# Patient Record
Sex: Female | Born: 1935
Health system: Southern US, Community
[De-identification: ages and names within clinical notes are randomized; demographics above are authoritative.]

## PROBLEM LIST (undated history)

## (undated) DIAGNOSIS — I10 Essential (primary) hypertension: Secondary | ICD-10-CM

## (undated) DIAGNOSIS — H33319 Horseshoe tear of retina without detachment, unspecified eye: Secondary | ICD-10-CM

## (undated) DIAGNOSIS — R911 Solitary pulmonary nodule: Secondary | ICD-10-CM

## (undated) DIAGNOSIS — K5790 Diverticulosis of intestine, part unspecified, without perforation or abscess without bleeding: Secondary | ICD-10-CM

## (undated) DIAGNOSIS — M8580 Other specified disorders of bone density and structure, unspecified site: Secondary | ICD-10-CM

## (undated) DIAGNOSIS — G43109 Migraine with aura, not intractable, without status migrainosus: Secondary | ICD-10-CM

## (undated) DIAGNOSIS — M1990 Unspecified osteoarthritis, unspecified site: Secondary | ICD-10-CM

## (undated) DIAGNOSIS — R931 Abnormal findings on diagnostic imaging of heart and coronary circulation: Secondary | ICD-10-CM

## (undated) DIAGNOSIS — M419 Scoliosis, unspecified: Secondary | ICD-10-CM

## (undated) DIAGNOSIS — G8929 Other chronic pain: Secondary | ICD-10-CM

## (undated) DIAGNOSIS — D869 Sarcoidosis, unspecified: Secondary | ICD-10-CM

## (undated) DIAGNOSIS — M549 Dorsalgia, unspecified: Secondary | ICD-10-CM

## (undated) HISTORY — DX: Abnormal findings on diagnostic imaging of heart and coronary circulation: R93.1

## (undated) HISTORY — DX: Other specified disorders of bone density and structure, unspecified site: M85.80

## (undated) HISTORY — DX: Other chronic pain: G89.29

## (undated) HISTORY — DX: Dorsalgia, unspecified: M54.9

## (undated) HISTORY — DX: Diverticulosis of intestine, part unspecified, without perforation or abscess without bleeding: K57.90

## (undated) HISTORY — DX: Scoliosis, unspecified: M41.9

## (undated) HISTORY — DX: Essential (primary) hypertension: I10

## (undated) HISTORY — DX: Horseshoe tear of retina without detachment, unspecified eye: H33.319

## (undated) HISTORY — PX: BACK SURGERY: SHX140

## (undated) HISTORY — DX: Solitary pulmonary nodule: R91.1

## (undated) HISTORY — PX: ABDOMINAL HYSTERECTOMY: SHX81

## (undated) HISTORY — DX: Sarcoidosis, unspecified: D86.9

## (undated) HISTORY — PX: BILATERAL SALPINGOOPHORECTOMY: SHX1223

## (undated) HISTORY — DX: Migraine with aura, not intractable, without status migrainosus: G43.109

## (undated) HISTORY — DX: Unspecified osteoarthritis, unspecified site: M19.90

---

## 2006-01-06 ENCOUNTER — Ambulatory Visit: Payer: Self-pay | Admitting: Internal Medicine

## 2006-04-21 ENCOUNTER — Ambulatory Visit: Payer: Self-pay | Admitting: Family Medicine

## 2006-05-31 ENCOUNTER — Ambulatory Visit: Payer: Self-pay | Admitting: Internal Medicine

## 2006-07-16 ENCOUNTER — Ambulatory Visit: Payer: Self-pay | Admitting: Internal Medicine

## 2006-08-20 ENCOUNTER — Encounter: Admission: RE | Admit: 2006-08-20 | Discharge: 2006-08-20 | Payer: Self-pay | Admitting: Internal Medicine

## 2006-08-27 ENCOUNTER — Ambulatory Visit: Payer: Self-pay | Admitting: Internal Medicine

## 2007-01-03 ENCOUNTER — Ambulatory Visit: Payer: Self-pay | Admitting: Internal Medicine

## 2007-01-05 ENCOUNTER — Ambulatory Visit: Payer: Self-pay | Admitting: Internal Medicine

## 2007-01-05 LAB — CONVERTED CEMR LAB
BUN: 12 mg/dL (ref 6–23)
Basophils Absolute: 0 10*3/uL (ref 0.0–0.1)
CO2: 29 meq/L (ref 19–32)
Chloride: 106 meq/L (ref 96–112)
Cholesterol: 208 mg/dL (ref 0–200)
Creatinine, Ser: 0.7 mg/dL (ref 0.4–1.2)
Direct LDL: 120.9 mg/dL
GFR calc Af Amer: 106 mL/min
GFR calc non Af Amer: 88 mL/min
Glucose, Bld: 94 mg/dL (ref 70–99)
Hemoglobin: 13.5 g/dL (ref 12.0–15.0)
MCHC: 34.2 g/dL (ref 30.0–36.0)
MCV: 92.1 fL (ref 78.0–100.0)
Monocytes Absolute: 0.4 10*3/uL (ref 0.2–0.7)
Neutro Abs: 2.9 10*3/uL (ref 1.4–7.7)
Neutrophils Relative %: 57.3 % (ref 43.0–77.0)
RDW: 12.5 % (ref 11.5–14.6)
Sodium: 141 meq/L (ref 135–145)
VLDL: 23 mg/dL (ref 0–40)
Vit D, 1,25-Dihydroxy: 34 (ref 20–57)
WBC: 5 10*3/uL (ref 4.5–10.5)

## 2007-01-13 ENCOUNTER — Ambulatory Visit: Payer: Self-pay | Admitting: Internal Medicine

## 2007-01-13 LAB — CONVERTED CEMR LAB: Sed Rate: 14 mm/hr (ref 0–25)

## 2007-01-24 ENCOUNTER — Ambulatory Visit: Payer: Self-pay | Admitting: Internal Medicine

## 2007-03-22 DIAGNOSIS — J309 Allergic rhinitis, unspecified: Secondary | ICD-10-CM | POA: Insufficient documentation

## 2007-03-22 DIAGNOSIS — M899 Disorder of bone, unspecified: Secondary | ICD-10-CM | POA: Insufficient documentation

## 2007-03-22 DIAGNOSIS — I10 Essential (primary) hypertension: Secondary | ICD-10-CM | POA: Insufficient documentation

## 2007-03-22 DIAGNOSIS — M949 Disorder of cartilage, unspecified: Secondary | ICD-10-CM

## 2007-03-22 DIAGNOSIS — M1990 Unspecified osteoarthritis, unspecified site: Secondary | ICD-10-CM | POA: Insufficient documentation

## 2007-04-25 ENCOUNTER — Ambulatory Visit: Payer: Self-pay | Admitting: Internal Medicine

## 2007-04-26 ENCOUNTER — Encounter: Payer: Self-pay | Admitting: Internal Medicine

## 2007-05-19 ENCOUNTER — Ambulatory Visit: Payer: Self-pay | Admitting: Internal Medicine

## 2007-05-19 DIAGNOSIS — G4700 Insomnia, unspecified: Secondary | ICD-10-CM | POA: Insufficient documentation

## 2007-08-22 ENCOUNTER — Encounter: Admission: RE | Admit: 2007-08-22 | Discharge: 2007-08-22 | Payer: Self-pay | Admitting: Internal Medicine

## 2007-08-25 ENCOUNTER — Encounter: Payer: Self-pay | Admitting: Internal Medicine

## 2007-09-06 ENCOUNTER — Ambulatory Visit: Payer: Self-pay | Admitting: Internal Medicine

## 2007-09-15 ENCOUNTER — Telehealth: Payer: Self-pay | Admitting: Internal Medicine

## 2007-09-20 ENCOUNTER — Ambulatory Visit: Payer: Self-pay | Admitting: Internal Medicine

## 2007-09-20 DIAGNOSIS — F430 Acute stress reaction: Secondary | ICD-10-CM | POA: Insufficient documentation

## 2007-09-21 ENCOUNTER — Encounter: Payer: Self-pay | Admitting: Internal Medicine

## 2007-10-28 ENCOUNTER — Ambulatory Visit: Payer: Self-pay | Admitting: Internal Medicine

## 2007-11-01 ENCOUNTER — Telehealth: Payer: Self-pay | Admitting: Family Medicine

## 2007-11-01 ENCOUNTER — Ambulatory Visit: Payer: Self-pay | Admitting: Family Medicine

## 2007-11-01 DIAGNOSIS — G43109 Migraine with aura, not intractable, without status migrainosus: Secondary | ICD-10-CM | POA: Insufficient documentation

## 2007-11-09 ENCOUNTER — Ambulatory Visit: Payer: Self-pay | Admitting: Psychiatry

## 2007-11-23 ENCOUNTER — Telehealth: Payer: Self-pay | Admitting: Internal Medicine

## 2008-01-11 ENCOUNTER — Encounter: Payer: Self-pay | Admitting: Internal Medicine

## 2008-01-18 ENCOUNTER — Encounter: Payer: Self-pay | Admitting: Internal Medicine

## 2008-02-01 ENCOUNTER — Telehealth: Payer: Self-pay | Admitting: Internal Medicine

## 2008-03-12 ENCOUNTER — Ambulatory Visit: Payer: Self-pay | Admitting: Family Medicine

## 2008-07-04 ENCOUNTER — Encounter: Payer: Self-pay | Admitting: Internal Medicine

## 2008-07-10 ENCOUNTER — Ambulatory Visit: Payer: Self-pay | Admitting: Gastroenterology

## 2008-07-10 DIAGNOSIS — R1032 Left lower quadrant pain: Secondary | ICD-10-CM | POA: Insufficient documentation

## 2008-07-10 DIAGNOSIS — K5900 Constipation, unspecified: Secondary | ICD-10-CM | POA: Insufficient documentation

## 2008-07-13 ENCOUNTER — Ambulatory Visit: Payer: Self-pay | Admitting: Gastroenterology

## 2008-07-13 ENCOUNTER — Encounter: Payer: Self-pay | Admitting: Gastroenterology

## 2008-07-16 ENCOUNTER — Encounter: Payer: Self-pay | Admitting: Gastroenterology

## 2008-07-23 ENCOUNTER — Inpatient Hospital Stay (HOSPITAL_COMMUNITY): Admission: EM | Admit: 2008-07-23 | Discharge: 2008-07-24 | Payer: Self-pay | Admitting: Emergency Medicine

## 2008-07-23 ENCOUNTER — Encounter (INDEPENDENT_AMBULATORY_CARE_PROVIDER_SITE_OTHER): Payer: Self-pay | Admitting: Neurology

## 2008-07-24 ENCOUNTER — Encounter (INDEPENDENT_AMBULATORY_CARE_PROVIDER_SITE_OTHER): Payer: Self-pay | Admitting: Neurology

## 2008-07-24 ENCOUNTER — Encounter: Payer: Self-pay | Admitting: Internal Medicine

## 2008-07-31 ENCOUNTER — Ambulatory Visit: Payer: Self-pay | Admitting: Internal Medicine

## 2008-08-22 ENCOUNTER — Encounter: Admission: RE | Admit: 2008-08-22 | Discharge: 2008-08-22 | Payer: Self-pay | Admitting: Internal Medicine

## 2008-09-17 ENCOUNTER — Telehealth: Payer: Self-pay | Admitting: Internal Medicine

## 2008-09-17 ENCOUNTER — Encounter: Payer: Self-pay | Admitting: Internal Medicine

## 2008-10-09 ENCOUNTER — Ambulatory Visit: Payer: Self-pay | Admitting: Internal Medicine

## 2008-10-31 ENCOUNTER — Telehealth: Payer: Self-pay | Admitting: Internal Medicine

## 2009-02-15 ENCOUNTER — Ambulatory Visit: Payer: Self-pay | Admitting: Internal Medicine

## 2009-02-15 DIAGNOSIS — M79609 Pain in unspecified limb: Secondary | ICD-10-CM | POA: Insufficient documentation

## 2009-02-15 DIAGNOSIS — M25519 Pain in unspecified shoulder: Secondary | ICD-10-CM | POA: Insufficient documentation

## 2009-02-19 ENCOUNTER — Encounter: Admission: RE | Admit: 2009-02-19 | Discharge: 2009-02-19 | Payer: Self-pay | Admitting: Internal Medicine

## 2009-06-10 ENCOUNTER — Telehealth: Payer: Self-pay | Admitting: *Deleted

## 2009-09-16 ENCOUNTER — Encounter: Admission: RE | Admit: 2009-09-16 | Discharge: 2009-09-16 | Payer: Self-pay | Admitting: Internal Medicine

## 2009-10-21 ENCOUNTER — Ambulatory Visit: Payer: Self-pay | Admitting: Internal Medicine

## 2009-10-21 DIAGNOSIS — H6120 Impacted cerumen, unspecified ear: Secondary | ICD-10-CM | POA: Insufficient documentation

## 2009-10-21 DIAGNOSIS — H9190 Unspecified hearing loss, unspecified ear: Secondary | ICD-10-CM | POA: Insufficient documentation

## 2009-11-20 ENCOUNTER — Telehealth: Payer: Self-pay | Admitting: Internal Medicine

## 2010-02-08 DIAGNOSIS — H81399 Other peripheral vertigo, unspecified ear: Secondary | ICD-10-CM | POA: Insufficient documentation

## 2010-02-17 ENCOUNTER — Ambulatory Visit: Payer: Self-pay | Admitting: Family Medicine

## 2010-02-18 LAB — CONVERTED CEMR LAB
Basophils Absolute: 0 10*3/uL (ref 0.0–0.1)
Hemoglobin: 12.9 g/dL (ref 12.0–15.0)
Lymphs Abs: 1.6 10*3/uL (ref 0.7–4.0)
MCHC: 34 g/dL (ref 30.0–36.0)
MCV: 93.2 fL (ref 78.0–100.0)
Monocytes Absolute: 0.5 10*3/uL (ref 0.1–1.0)
RDW: 13.6 % (ref 11.5–14.6)
WBC: 6.6 10*3/uL (ref 4.5–10.5)

## 2010-02-20 ENCOUNTER — Telehealth (INDEPENDENT_AMBULATORY_CARE_PROVIDER_SITE_OTHER): Payer: Self-pay | Admitting: *Deleted

## 2010-02-20 ENCOUNTER — Ambulatory Visit: Payer: Self-pay | Admitting: Cardiology

## 2010-05-02 ENCOUNTER — Ambulatory Visit: Payer: Self-pay | Admitting: Internal Medicine

## 2010-05-02 DIAGNOSIS — R42 Dizziness and giddiness: Secondary | ICD-10-CM | POA: Insufficient documentation

## 2010-05-02 DIAGNOSIS — G43809 Other migraine, not intractable, without status migrainosus: Secondary | ICD-10-CM | POA: Insufficient documentation

## 2010-05-02 DIAGNOSIS — R5381 Other malaise: Secondary | ICD-10-CM | POA: Insufficient documentation

## 2010-05-02 DIAGNOSIS — R5383 Other fatigue: Secondary | ICD-10-CM | POA: Insufficient documentation

## 2010-05-03 ENCOUNTER — Encounter: Payer: Self-pay | Admitting: Internal Medicine

## 2010-05-03 LAB — CONVERTED CEMR LAB
Calcium, Total (PTH): 9.6 mg/dL (ref 8.4–10.5)
Vit D, 25-Hydroxy: 59 ng/mL (ref 30–89)

## 2010-05-05 ENCOUNTER — Encounter: Payer: Self-pay | Admitting: Internal Medicine

## 2010-05-05 ENCOUNTER — Ambulatory Visit: Payer: Self-pay | Admitting: Family Medicine

## 2010-05-07 LAB — CONVERTED CEMR LAB
ALT: 19 units/L (ref 0–35)
AST: 28 units/L (ref 0–37)
Alkaline Phosphatase: 89 units/L (ref 39–117)
BUN: 11 mg/dL (ref 6–23)
Basophils Absolute: 0 10*3/uL (ref 0.0–0.1)
CRP, High Sensitivity: 0.44 (ref 0.00–5.00)
Cholesterol: 213 mg/dL — ABNORMAL HIGH (ref 0–200)
Direct LDL: 120.7 mg/dL
Eosinophils Absolute: 0 10*3/uL (ref 0.0–0.7)
Eosinophils Relative: 0.6 % (ref 0.0–5.0)
Folate: 18.5 ng/mL
Free T4: 0.72 ng/dL (ref 0.60–1.60)
GFR calc non Af Amer: 88.43 mL/min (ref >60)
HCT: 37.2 % (ref 36.0–46.0)
HDL: 74.5 mg/dL (ref >39.00)
Hemoglobin: 12.8 g/dL (ref 12.0–15.0)
Lymphocytes Relative: 23.9 % (ref 12.0–46.0)
MCHC: 34.5 g/dL (ref 30.0–36.0)
MCV: 93.6 fL (ref 78.0–100.0)
Neutrophils Relative %: 68 % (ref 43.0–77.0)
Platelets: 225 10*3/uL (ref 150.0–400.0)
Potassium: 5 meq/L (ref 3.5–5.1)
RBC: 3.97 M/uL (ref 3.87–5.11)
Total Bilirubin: 1.4 mg/dL — ABNORMAL HIGH (ref 0.3–1.2)
Total CHOL/HDL Ratio: 3
Total Protein: 6.9 g/dL (ref 6.0–8.3)
Triglycerides: 100 mg/dL (ref 0.0–149.0)
VLDL: 20 mg/dL (ref 0.0–40.0)
Vitamin B-12: 350 pg/mL (ref 211–911)

## 2010-06-24 ENCOUNTER — Ambulatory Visit: Payer: Self-pay | Admitting: Internal Medicine

## 2010-06-24 DIAGNOSIS — I495 Sick sinus syndrome: Secondary | ICD-10-CM | POA: Insufficient documentation

## 2010-06-29 ENCOUNTER — Encounter: Payer: Self-pay | Admitting: Internal Medicine

## 2010-08-07 HISTORY — PX: CATARACT EXTRACTION: SUR2

## 2010-08-19 LAB — HM DIABETES EYE EXAM

## 2010-08-25 ENCOUNTER — Ambulatory Visit: Payer: Self-pay | Admitting: Internal Medicine

## 2010-09-16 ENCOUNTER — Encounter
Admission: RE | Admit: 2010-09-16 | Discharge: 2010-09-16 | Payer: Self-pay | Source: Home / Self Care | Attending: Internal Medicine | Admitting: Internal Medicine

## 2010-09-28 ENCOUNTER — Encounter: Payer: Self-pay | Admitting: Family Medicine

## 2010-09-28 ENCOUNTER — Encounter: Payer: Self-pay | Admitting: Internal Medicine

## 2010-10-07 NOTE — Progress Notes (Signed)
Summary: fyi bruise   Phone Note Call from Patient Call back at Home Phone [phone]   Caller: Patient Summary of Call: Pt has a huge bruise about the size of a fist on her upper arm where she dropped a dresser mirror the inside of her upper left arm. It is not throbbing or swollen. There is no redness to it. The center is turning yellow like it is clearing up. Her daughter states that it looks like it is getting better as well. I told pt that it does sound like it is getting better but if it gets red or looks like it might get infected then give [place] a call. Initial call taken by: Romualdo Bolk, CMA (AAMA),  November 20, 2009 9:45 AM

## 2010-10-07 NOTE — Assessment & Plan Note (Signed)
Summary: ear clogged//ccm   Vital Signs:  Patient profile:   77 year old female Menstrual status:  hysterectomy Height:      60.25 inches Weight:      130 pounds BMI:     25.27 Pulse rate:   72 / minute BP sitting:   120 / 80  (right arm) Cuff size:   regular  Vitals Entered By: Romualdo Bolk, CMA (AAMA) (October 21, 2009 3:44 PM) CC: Left ears clogged started back in dec and took sudafed then went away and came back on 2/11. No pain.   History of Present Illness: Kristin Jimenez comesin today for   because left ear feels clogged and can hear well out of it. No pain or sig uri. to go out of town soon.     No q tips.  Bp doing well. Mood  stable and not depressed.     Preventive Screening-Counseling & Management  Alcohol-Tobacco     Alcohol drinks/day: 1     Alcohol type: wine     Smoking Status: never  Caffeine-Diet-Exercise     Caffeine use/day: 2     Does Patient Exercise: yes     Type of exercise: water aerobics and walking     Times/week: 6  Current Medications (verified): 1)  Multivitamins  Tabs (Multiple Vitamin) .Marland Kitchen.. 1 Tablet By Mouth Three Times A Day 2)  Vitamin D3 10000 Unit Caps (Cholecalciferol) .Marland Kitchen.. 1 Capsule By Mouth Once Daily 3)  Calcium 600/vitamin D 600-400 Mg-Unit Tabs (Calcium Carbonate-Vitamin D) .Marland Kitchen.. 1 Tablet By Mouth Once Daily 4)  Aspirin 81 Mg  Tabs (Aspirin) 5)  Atenolol 50 Mg Tabs (Atenolol) .... One Per Day 6)  Ambien 5 Mg Tabs (Zolpidem Tartrate) .Marland Kitchen.. 1 By Mouth At Bedtime  Allergies (verified): No Known Drug Allergies  Past History:  Past medical, surgical, family and social histories (including risk factors) reviewed for relevance to current acute and chronic problems.  Past Medical History: Reviewed history from 10/09/2008 and no changes required. Osteoarthritis Osteopenia Hypertension Allergic rhinitis Ocular migraines Sarcoidosis OS retinal tear Scoliosis Chronic back pain Hosp   ocular migraine   consult  [place] nodule    [facility]    Consults Dr. Perry Mount in ED    Past Surgical History: Reviewed history from 07/10/2008 and no changes required. Hysterectomy BSO Back surgery x 2  Past History:  Care Management: Neurology: Pearlean Brownie in ED Gastroenterology: Russella Dar  Family History: Reviewed history from 06/22/2008 and no changes required. Family History of Aneurysm Aortic Family History of Arthritis Family History High cholesterol Family History Hypertension Family History Lung cancer Family History of Cardiovascular disorder Fam hx Stroke No FH of Colon Cancer:  Social History: Reviewed history from 10/09/2008 and no changes required. Retired Widowed, husband passed away in 03/25/2009Never Smoked Alcohol use-yes  wine every night.  red or white. Drug use-no Regular exercise-yes Daily Caffeine Use        Review of Systems  The patient denies vision loss, hoarseness, prolonged cough, abdominal pain, transient blindness, difficulty walking, depression, unusual weight change, abnormal bleeding, enlarged lymph nodes, and angioedema.    Physical Exam  General:  Well-developed,well-nourished,in no acute distress; alert,appropriate and cooperative throughout examination Head:  normocephalic, atraumatic, and no abnormalities observed.   Eyes:  vision grossly intact.   PERRL, EOMs full, conjunctiva clear  Ears:  R ear normal and no external deformities.  right ear  with brown wax     impacted  irrigated with water and tm intact and hearing some better  Nose:  no external deformity, no external erythema, and no nasal discharge.   Mouth:  pharynx pink and moist.   Neck:  No deformities, masses, or tenderness noted. Neurologic:  alert & oriented X3 and gait normal.   Cervical Nodes:  No lymphadenopathy noted Psych:  Oriented X3, good eye contact, and not anxious appearing.     Impression & Recommendations:  Problem # 1:  DECREASED HEARING, LEFT EAR  (ICD-389.9) related to wax   Problem # 2:  CERUMEN IMPACTION (ICD-380.4) left .   Problem # 3:  HYPERTENSION, LABILE (ICD-401.9) stable  Her updated medication list for this problem includes:    Atenolol 50 Mg Tabs (Atenolol) ..... One per day  Complete Medication List: 1)  Multivitamins Tabs (Multiple vitamin) .Marland Kitchen.. 1 tablet by mouth three times a day 2)  Vitamin D3 10000 Unit Caps (Cholecalciferol) .Marland Kitchen.. 1 capsule by mouth once daily 3)  Calcium 600/vitamin D 600-400 Mg-unit Tabs (Calcium carbonate-vitamin d) .Marland Kitchen.. 1 tablet by mouth once daily 4)  Aspirin 81 Mg Tabs (Aspirin) 5)  Atenolol 50 Mg Tabs (Atenolol) .... One per day 6)  Ambien 5 Mg Tabs (Zolpidem tartrate) .Marland Kitchen.. 1 by mouth at bedtime  Patient Instructions: 1)  recheck as needed .

## 2010-10-07 NOTE — Assessment & Plan Note (Signed)
Summary: FU ON DIZZINESS/NJR   Vital Signs:  Patient profile:   77 year old female Menstrual status:  hysterectomy Weight:      125 pounds Pulse rate:   72 / minute BP sitting:   110 / 64  (left arm) Cuff size:   regular  Vitals Entered By: Romualdo Bolk, CMA (AAMA) (May 02, 2010 10:14 AM) CC: Follow-up visit on Dizziness from back in June. Pt states that it comes and goes. She also has lots of fatigue with it. Pt is moving so she is under some stress from the move.   History of Present Illness: Kristin Jimenez comes in today   for dizziness . Had  major dizziness in  June    felt to be vertigo   peripheral cause   wiith vomiting  and then since then got  episodes transient  .   poss positional  without assoicate .sx   ...... For the past 2 weeks has noted  increasing  fatigue " tired al l the time "but sleeps well.      Not rested in am.      no bleeding  eating well and exercising . is a vegetarian.  mood  prob no change some anxiety at times  .    HAs: gets auras   with opthalm  migrains with no pain. more frequently  weekly inn thelast months    dont last long.     Has had them  since 1998 . minutes to     no rx for this ocass HA.         Preventive Screening-Counseling & Management  Alcohol-Tobacco     Alcohol drinks/day: 1     Alcohol type: wine     Smoking Status: never  Caffeine-Diet-Exercise     Caffeine use/day: 2     Does Patient Exercise: yes     Type of exercise: water aerobics and walking     Times/week: 6  Current Medications (verified): 1)  Multivitamins  Tabs (Multiple Vitamin) .Marland Kitchen.. 1 Tablet By Mouth Once Daily 2)  Vitamin D3 10000 Unit Caps (Cholecalciferol) .... 2  Capsule By Mouth Once Daily 3)  Calcium 600/vitamin D 600-400 Mg-Unit Tabs (Calcium Carbonate-Vitamin D) .Marland Kitchen.. 1 Tablet By Mouth Once Daily 4)  Aspirin 81 Mg  Tabs (Aspirin) 5)  Atenolol 50 Mg Tabs (Atenolol) .... 1/2 Per Day 6)  Ambien 5 Mg Tabs (Zolpidem Tartrate) .Marland Kitchen.. 1 By Mouth  At Bedtime As Needed  Allergies (verified): No Known Drug Allergies  Past History:  Past medical, surgical, family and social histories (including risk factors) reviewed, and no changes noted (except as noted below).  Past Medical History: Osteoarthritis Osteopenia Hypertension Allergic rhinitis Ocular migraines Sarcoidosis OS retinal tear Scoliosis Chronic back pain Hosp   ocular migraine   consult [place] nodules    Seen  [facility]    Consults Dr. Perry Mount in ED    Past Surgical History: Reviewed history from 07/10/2008 and no changes required. Hysterectomy BSO Back surgery x 2  Past History:  Care Management: Neurology: Pearlean Brownie in ED Gastroenterology: [facility]: pulm nodules  Opthal Nile Riggs   Family History: Reviewed history from 06/22/2008 and no changes required. Family History of Aneurysm Aortic Family History of Arthritis Family History High cholesterol Family History Hypertension Family History Lung cancer Family History of Cardiovascular disorder Fam hx Stroke No FH of Colon Cancer:  Social History: Reviewed history from 10/09/2008 and no changes required. Retired Widowed, husband passed  away in March 2009 Never Smoked Alcohol use-yes  wine every night.  red or white. Drug use-no Regular exercise-yes Daily Caffeine Use   is a vegetarian moslty       Review of Systems  The patient denies anorexia, fever, weight loss, weight gain, hoarseness, chest pain, syncope, dyspnea on exertion, peripheral edema, prolonged cough, abdominal pain, melena, hematochezia, difficulty walking, depression, unusual weight change, abnormal bleeding, enlarged lymph nodes, and angioedema.         vision change with her migraine    ocass ha  minimal   Physical Exam  General:  Well-developed,well-nourished,in no acute distress; alert,appropriate and cooperative throughout examination Head:  normocephalic and atraumatic.   Eyes:  PERRL, EOMs full,  conjunctiva clear  Ears:  R ear normal, L ear normal, and no external deformities.    mod wax left ear  Nose:  no external deformity, no external erythema, and no nasal discharge.   Mouth:  good dentition and pharynx pink and moist.   Neck:  No deformities, masses, or tenderness noted. no bruits heard Lungs:  Normal respiratory effort, chest expands symmetrically. Lungs are clear to auscultation, no crackles or wheezes. Heart:  Normal rate and regular rhythm. S1 and S2 normal without gallop, murmur, click, rub or other extra sounds. Abdomen:  Bowel sounds positive,abdomen soft and non-tender without masses, organomegaly or  noted. Pulses:  pulses intact without delay   Extremities:  no clubbing cyanosis or edema  Neurologic:  alert & oriented X3, cranial nerves IIi-XII intact, and  gait normal, DTRs symmetrical and normal, and Romberg negative.   good balance  non focla exam  Skin:  turgor normal, color normal, and no ecchymoses.   Cervical Nodes:  No lymphadenopathy noted Psych:  Oriented X3, normally interactive, good eye contact, not anxious appearing, and not depressed appearing.     Impression & Recommendations:  Problem # 1:  FATIGUE (ICD-780.79) r/o metabolic    poss not related to other signs is a vegetarion  check for  b12  defic .   no evidence of  sig sleep dx .  rarely takes an [place]  . has been on low dose atenolol  Orders: TLB-TSH (Thyroid Stimulating Hormone) (84443-TSH) TLB-Hepatic/Liver Function Pnl (80076-HEPATIC) TLB-CBC Platelet - w/Differential (85025-CBCD) TLB-BMP (Basic Metabolic Panel-BMET) (80048-METABOL) TLB-B12 + Folate Pnl (16109_60454-U98/JXB) TLB-CRP-High Sensitivity (C-Reactive Protein) (86140-FCRP) T-Parathyroid Hormone, Intact w/ Calcium (14782-95621) TLB-T4 (Thyrox), Free [phone]) Venipuncture (96295) Specimen Handling (28413)  Problem # 2:  MIGRAINE, OPHTHALMIC (ICD-346.80) Assessment: Deteriorated  more frequent recently about once a week.     ?   changing pattern.    followed by dr Nile Riggs  .  no focal signs on exam today .   rec   neuro  opinion  eval  poss other meds for suppression.   Her updated medication list for this problem includes:    Aspirin 81 Mg Tabs (Aspirin)    Atenolol 50 Mg Tabs (Atenolol) .Marland Kitchen... 1/2 per day  Orders: Neurology Referral (Neuro)  Problem # 3:  VERTIGO (ICD-780.4)  sounds     more vestibular and positional at present     today  normal cv exam otherwise   Orders: Neurology Referral (Neuro)  Problem # 4:  PULMONARY NODULE MULTIPLE [facility] [phone]) followed [facility].     Problem # 5:  HYPERTENSION, LABILE (ICD-401.9) controlled on very low dose med  for a while and   exxercising well.    Her updated medication list for this problem includes:  Atenolol 50 Mg Tabs (Atenolol) .Marland Kitchen... 1/2 per day  Orders: TLB-BMP (Basic Metabolic Panel-BMET) (80048-METABOL) TLB-Lipid Panel (80061-LIPID) Venipuncture (54098) Specimen Handling (11914)  Problem # 6:  OSTEOPENIA (ICD-733.90) due for dexa  Her updated medication list for this problem includes:    Vitamin D3 10000 Unit Caps (Cholecalciferol) .Marland Kitchen... 2  capsule by mouth once daily    Calcium 600/vitamin D 600-400 Mg-unit Tabs (Calcium carbonate-vitamin d) .Marland Kitchen... 1 tablet by mouth once daily  Orders: T-Vitamin D (25-Hydroxy) (78295-62130) T-Parathyroid Hormone, Intact w/ Calcium (86578-46962) Venipuncture (95284) Specimen Handling (13244)  Complete Medication List: 1)  Multivitamins Tabs (Multiple vitamin) .Marland Kitchen.. 1 tablet by mouth once daily 2)  Vitamin D3 10000 Unit Caps (Cholecalciferol) .... 2  capsule by mouth once daily 3)  Calcium 600/vitamin D 600-400 Mg-unit Tabs (Calcium carbonate-vitamin d) .Marland Kitchen.. 1 tablet by mouth once daily 4)  Aspirin 81 Mg Tabs (Aspirin) 5)  Atenolol 50 Mg Tabs (Atenolol) .... 1/2 per day 6)  Ambien 5 Mg Tabs (Zolpidem tartrate) .Marland Kitchen.. 1 by mouth at bedtime as needed  Patient Instructions: 1)  You will  be informed of lab results when available.  2)  will do a neuro  referral. 3)  Schedule DEXA scan   ( osteopenia)   4)  follow up after above evaluation.  5)  ROV in 1 -2 months. or as needed.

## 2010-10-07 NOTE — Progress Notes (Signed)
Summary: discuss CT results  Phone Note Call from Patient   Caller: Patient Call For: Dr. Abner Greenspan Summary of Call: Pt is calling for CT results. [phone] Initial call taken by: Lynann Beaver CMA,  February 20, 2010 4:17 PM  Follow-up for Phone Call        Left message for pt to return my call

## 2010-10-07 NOTE — Assessment & Plan Note (Signed)
Summary: pt will come in fasting/njr ok per doc/njr pt rsc/njr   Vital Signs:  Patient profile:   77 year old female Menstrual status:  hysterectomy Height:      60.25 inches (153.04 cm) Weight:      125 pounds (56.82 kg) O2 Sat:      99 % on Room air Temp:     98.5 degrees F (36.94 degrees C) oral Pulse rate:   67 / minute BP sitting:   160 / 82  (left arm) Cuff size:   regular  Vitals Entered By: Josph Macho RMA (June 24, 2010 11:00 AM)  O2 Flow:  Room air  Serial Vital Signs/Assessments:  Time      Position  BP       Pulse  Resp  Temp     By           R Arm     160/80                         Madelin Headings MD           L Arm     148/80                         Madelin Headings MD  Comments: reg cuff sitting By: Madelin Headings MD   CC: Physical/ CF Is Patient Diabetic? No   History of Present Illness: Kristin Jimenez   Bp 125/72   readings.  this am  exercising   checking readings two times a day and doing ok .  HAs doing better since moved.  opthalmologist  not concerned  and no need forfurther evaluation.  Sleep better  Here for Medicare AWV:  1.   Risk factors based on Past M, S, F history: 2.   Physical Activities:  3.   Depression/mood:  good  4.   Hearing:  hearing is good  5.   ADL's:  does all adls and travels  6.   Fall Risk:  none  does yoga and exercisie 7.   Home Safety:  no falls   8.   Height, weight, &visual acuity: sees opthalmology   9.   Counseling:  10.   Labs ordered based on risk factors:  11.           Referral Coordination 12.           Care Plan 13.            Cognitive Assessment  Pt is A&Ox3,affect,speech,memory,attention,&motor skills appear intact.    Preventive Screening-Counseling & Management  Alcohol-Tobacco     Alcohol drinks/day: 1     Alcohol type: wine     Smoking Status: never  Caffeine-Diet-Exercise     Caffeine use/day: 2     Does Patient Exercise: yes     Type of exercise: water aerobics and walking    Times/week: 6  Hep-HIV-STD-Contraception     Dental Visit-last 6 months yes     Sun Exposure-Excessive: no  Safety-Violence-Falls     Seat Belt Use: 100     Firearms in the Home: no firearms in the home     Smoke Detectors: yes      Blood Transfusions:  no and autologous  in 2000.    Current Medications (verified): 1)  Multivitamins  Tabs (Multiple Vitamin) .Marland Kitchen.. 1 Tablet By Mouth Once Daily 2)  Vitamin D3 10000 Unit  Caps (Cholecalciferol) .... 2  Capsule By Mouth Once Daily 3)  Calcium 600/vitamin D 600-400 Mg-Unit Tabs (Calcium Carbonate-Vitamin D) .Marland Kitchen.. 1 Tablet By Mouth Once Daily 4)  Aspirin 81 Mg  Tabs (Aspirin) 5)  Atenolol 50 Mg Tabs (Atenolol) .... 1/2 Per Day 6)  Ambien 5 Mg Tabs (Zolpidem Tartrate) .Marland Kitchen.. 1 By Mouth At Bedtime As Needed  Allergies (verified): No Known Drug Allergies  Past History:  Past medical, surgical, family and social histories (including risk factors) reviewed, and no changes noted (except as noted below).  Past Medical History: Reviewed history from 05/02/2010 and no changes required. Osteoarthritis Osteopenia Hypertension Allergic rhinitis Ocular migraines Sarcoidosis OS retinal tear Scoliosis Chronic back pain Hosp   ocular migraine   consult [place] nodules    [facility]  [facility]    Consults Dr. Perry Mount in ED    Past Surgical History: Reviewed history from 07/10/2008 and no changes required. Hysterectomy BSO Back surgery x 2  Family History: Reviewed history from 06/22/2008 and no changes required. Family History of Aneurysm Aortic Family History of Arthritis Family History High cholesterol Family History Hypertension Family History Lung cancer Family History of Cardiovascular disorder Fam hx Stroke No FH of Colon Cancer:  Social History: Reviewed history from 05/02/2010 and no changes required. 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   Widowed, husband passed away in 2009-04-02Never Smoked Alcohol use-yes   wine every night.  red or white. Drug use-no Regular exercise-yes Daily Caffeine Use   is a vegetarian moslty     Dental Care w/in 6 mos.:  yes Sun Exposure-Excessive:  no Blood Transfusions:  no, autologous  in 2000  Review of Systems  The patient denies abdominal pain, difficulty walking, enlarged lymph nodes, and angioedema.         ocass   lightheaded but no cp sob.  has better .  no gi gu signs and no injury or fall.      Physical Exam  General:  alert, well-developed, and well-nourished.   Head:  normocephalic and atraumatic.   Eyes:  vision grossly intact.  PERRL, EOMs full, conjunctiva clear  Ears:  left ear with some wax tm grey  right nl no external deformities.   Nose:  no external deformity and no nasal discharge.   Mouth:  pharynx pink and moist.   teeth in  good repari Neck:  No deformities, masses, or tenderness noted. Breasts:  No mass, nodules, thickening, tenderness, bulging, retraction, inflamation, nipple discharge or skin changes noted.   Lungs:  Normal respiratory effort, chest expands symmetrically. Lungs are clear to auscultation, no crackles or wheezes.no dullness.   Heart:  Normal rate and regular rhythm. S1 and S2 normal without gallop, murmur, click, rub or other extra sounds.no lifts.   Abdomen:  Bowel sounds positive,abdomen soft and non-tender without masses, organomegaly or hernias noted. Genitalia:  Pelvic Exam:        External: normal female genitalia without lesions or masses        Vagina: normal without lesions or masses        Cervix:absent        Adnexa: normal bimanual exam without masses or fullness        Uterus: absent        Pap smear: not performed Msk:  no joint swelling and no joint warmth.  OA changes  Pulses:  pulses intact without delay   Extremities:  no clubbing cyanosis or edema  Neurologic:  alert & oriented  X3, strength normal in all extremities, gait normal, and DTRs symmetrical and normal.   Pt is  A&Ox3,affect,speech,memory,attention,&motor skills appear intact.  Skin:  turgor normal, color normal, no suspicious lesions, no ecchymoses, and no petechiae.   Cervical Nodes:  No lymphadenopathy noted Axillary Nodes:  No palpable lymphadenopathy Inguinal Nodes:  No significant adenopathy Psych:  Normal eye contact, appropriate affect. Cognition appears normal.   ekg sinus bradycardia  rate 50   Contraindications/Deferment of Procedures/Staging:    Test/Procedure: FLU VAX    Reason for deferment: patient declined   Impression & Recommendations:  Problem # 1:  Preventive Health Care (ICD-V70.0)  counseled adult prevention continue healthy eating and exercise   Orders: Medicare -1st Annual Wellness Visit [phone])  Problem # 2:  HYPERTENSION, LABILE (ICD-401.9)  pt states  is  very good at  [facility] and this is white coat effect .     Her updated medication list for this problem includes:    Atenolol 50 Mg Tabs (Atenolol) .Marland Kitchen... 1/2 per day    Amlodipine Besylate 2.5 Mg Tabs (Amlodipine besylate) .Marland Kitchen... 1 by mouth once daily  may increase to 2 by mouth once daily  Orders: EKG w/ Interpretation (93000)  Problem # 3:  MIGRAINE, OPHTHALMIC (ICD-346.80) Assessment: Improved eye doc and she doesnt think needs intervention at this time  Her updated medication list for this problem includes:    Aspirin 81 Mg Tabs (Aspirin)    Atenolol 50 Mg Tabs (Atenolol) .Marland Kitchen... 1/2 per day  Problem # 4:  OSTEOPENIA (ICD-733.90) stable  Her updated medication list for this problem includes:    Vitamin D3 10000 Unit Caps (Cholecalciferol) .Marland Kitchen... 2  capsule by mouth once daily    Calcium 600/vitamin D 600-400 Mg-unit Tabs (Calcium carbonate-vitamin d) .Marland Kitchen... 1 tablet by mouth once daily  Problem # 5:  SINUS BRADYCARDIA (ICD-427.81)  ? if contibuting to some dizzines at times.     will change to ca channel blocker   low dose and f.u      Her updated medication list for this problem includes:    Aspirin  81 Mg Tabs (Aspirin)    Atenolol 50 Mg Tabs (Atenolol) .Marland Kitchen... 1/2 per day  Complete Medication List: 1)  Multivitamins Tabs (Multiple vitamin) .Marland Kitchen.. 1 tablet by mouth once daily 2)  Vitamin D3 10000 Unit Caps (Cholecalciferol) .... 2  capsule by mouth once daily 3)  Calcium 600/vitamin D 600-400 Mg-unit Tabs (Calcium carbonate-vitamin d) .Marland Kitchen.. 1 tablet by mouth once daily 4)  Aspirin 81 Mg Tabs (Aspirin) 5)  Atenolol 50 Mg Tabs (Atenolol) .... 1/2 per day 6)  Ambien 5 Mg Tabs (Zolpidem tartrate) .Marland Kitchen.. 1 by mouth at bedtime as needed 7)  Amlodipine Besylate 2.5 Mg Tabs (Amlodipine besylate) .Marland Kitchen.. 1 by mouth once daily  may increase to 2 by mouth once daily  Patient Instructions: 1)  continue BP  monitoring   and call if elevated  2)  wean off the atenolol and ty every other day  and then begin the low dose norvasc .  3)  exercise   to continue  4)  call or seek neuro eval if  migraines  become persistent or  progressive  .  Your labs are normal today.  5)  return office visit in 2  and bring in Bp monitor at that time.  Prescriptions: AMLODIPINE BESYLATE 2.5 MG TABS (AMLODIPINE BESYLATE) 1 by mouth once daily  may increase to 2 by mouth once daily  #30 x 3  Entered and Authorized by:   Madelin Headings MD   Signed by:   Madelin Headings MD on 06/24/2010   Method used:   Print then Give to Patient   RxID:   [id number]    Orders Added: 1)  EKG w/ Interpretation [93000] 2)  Medicare -1st Annual Wellness Visit [G0438] 3)  Est. Patient Level III [01027]    Preventive Care Screening  Mammogram:    Date:  09/07/2009    Results:  normal   Prior Values:    Mammogram:  ASSESSMENT: Negative - BI-RADS 1^MM DIGITAL SCREENING (09/16/2009)    Colonoscopy:  Location:  [facility].   (07/13/2008)    Last Tetanus Booster:  Historical (09/08/2003)    Last Pneumovax:  Historical (09/07/2004)   Appended Document: pt will come in fasting/njr ok per doc/njr pt  rsc/njr    Clinical Lists Changes  Observations: Added new observation of BMI: 24.30  (06/30/2010 11:55) Added new observation of WEIGHT: 125 lb (06/30/2010 11:55) Added new observation of HEIGHT: 60.25 in (06/30/2010 11:55) Added new observation of PRIMARY MD: Berniece Andreas, MD  (06/30/2010 11:55)         Vital Signs:  Patient profile:   77 year old female Menstrual status:  hysterectomy Height:      60.25 inches Weight:      125 pounds BMI:     24.30

## 2010-10-07 NOTE — Miscellaneous (Signed)
Summary: BONE DENSITY  Clinical Lists Changes  Orders: Added new Test order of T-Bone Densitometry (77080) - Signed Added new Test order of T-Lumbar Vertebral Assessment (77082) - Signed 

## 2010-10-07 NOTE — Assessment & Plan Note (Signed)
Summary: vertigo/dm   Vital Signs:  Patient profile:   77 year old female Menstrual status:  hysterectomy Height:      60.25 inches (153.04 cm) Weight:      125 pounds (56.82 kg) O2 Sat:      99 % on Room air Temp:     98.1 degrees F (36.72 degrees C) oral Pulse rate:   64 / minute BP sitting:   148 / 82  (left arm) Cuff size:   regular  Vitals Entered By: Josph Macho RMA (February 17, 2010 10:15 AM)  O2 Flow:  Room air CC: Vertigo X9days/ pt states she is no longer taking Aspirin, Ambien, and she reduced her Atenolol to 1/2 pill/ CF Is Patient Diabetic? No   History of Present Illness: Patient in for evaluation secondary to some ongoing vertigo and a concerning episde a week ago Saturday, roughly 9 days ago. The patient had been in [place] with her 63 yo grandson in preparation for a flight back home to [place]. The had eaten out the night before and then patient arose at 4:30am to prepare for the flight and she experienced the sudden onset of vertigo, with a true sensation of spinning. She is unable to say whether she or the room was spinning but she felt ill to the point of an episode of diaphoresis/vomitting. She was staying at [facility] and they called rescue. She reports they came and did an EKG she describes as normal. She remembers a systolic BP of 120s and a blood sugar of 105. She was offered transfer to the hospital for further evaluation but she declined because she was feeling better and she chose to keep her flight and went on the [place]. She had no further episodes of vomitting, never any diarrhea or severe abdominal pain. She did have some anorexia and nausea and maintained a clear liquid diet for the next 24 hours. She reports her appetite is improved but she has some persistent sense of malaise. She denies ever having any HA/weakness/numbness/disequilibrium/CP/palp/syncope or presyncope during any of this.  Current Medications (verified): 1)  Multivitamins  Tabs  (Multiple Vitamin) .Marland Kitchen.. 1 Tablet By Mouth Three Times A Day 2)  Vitamin D3 10000 Unit Caps (Cholecalciferol) .Marland Kitchen.. 1 Capsule By Mouth Once Daily 3)  Calcium 600/vitamin D 600-400 Mg-Unit Tabs (Calcium Carbonate-Vitamin D) .Marland Kitchen.. 1 Tablet By Mouth Once Daily 4)  Aspirin 81 Mg  Tabs (Aspirin) 5)  Atenolol 50 Mg Tabs (Atenolol) .... 1/2 Per Day 6)  Ambien 5 Mg Tabs (Zolpidem Tartrate) .Marland Kitchen.. 1 By Mouth At Bedtime  Allergies (verified): No Known Drug Allergies  Past History:  Past medical history reviewed for relevance to current acute and chronic problems.  Past Medical History: Reviewed history from 10/09/2008 and no changes required. Osteoarthritis Osteopenia Hypertension Allergic rhinitis Ocular migraines Sarcoidosis OS retinal tear Scoliosis Chronic back pain Hosp   ocular migraine   consult [place] nodule    [facility]    Consults Dr. Perry Mount in ED    Social History: Reviewed history from 10/09/2008 and no changes required. Retired Widowed, husband passed away in 04-03-2009Never Smoked Alcohol use-yes  wine every night.  red or white. Drug use-no Regular exercise-yes Daily Caffeine Use        Review of Systems      See HPI  Physical Exam  General:  Well-developed,well-nourished,in no acute distress; alert,appropriate and cooperative throughout examination Head:  Normocephalic and atraumatic without obvious abnormalities. No apparent alopecia  or balding. Eyes:  No corneal or conjunctival inflammation noted. EOMI. Perrla. Vision grossly normal. Nose:  External nasal examination shows no deformity or inflammation. Nasal mucosa are pink and moist without lesions or exudates. Mouth:  Oral mucosa and oropharynx without lesions or exudates.  Teeth in good repair. Neck:  No deformities, masses, or tenderness noted. Lungs:  Normal respiratory effort, chest expands symmetrically. Lungs are clear to auscultation, no crackles or wheezes. Heart:  Normal rate and  regular rhythm. S1 and S2 normal without gallop, murmur, click, rub or other extra sounds. Abdomen:  Bowel sounds positive,abdomen soft and non-tender without masses, organomegaly or hernias noted. Msk:  No deformity or scoliosis noted of thoracic or lumbar spine.   Extremities:  No clubbing, cyanosis, edema, or deformity noted with normal full range of motion of all joints.   Neurologic:  No cranial nerve deficits noted. Station and gait are normal. Plantar reflexes are down-going bilaterally. DTRs are symmetrical throughout. Sensory, motor and coordinative functions appear intact. Psych:  Cognition and judgment appear intact. Alert and cooperative with normal attention span and concentration. No apparent delusions, illusions, hallucinations   Impression & Recommendations:  Problem # 1:  UNSPECIFIED PERIPHERAL VERTIGO (ICD-386.10)  Orders: TLB-CBC Platelet - w/Differential (85025-CBCD) TLB-Sedimentation Rate (ESR) (85652-ESR) Likely BPV or possibly post viral reaction. Patient had been on a cruise the week before this episode occurred and reports a viral gastroenteritis was spreading throughout the ship. Patient very anxious about possiblity of stroke secondary to her husband's stroke initially manifested as dizziness, will order a CT head to further evaluate. Will prescribe Meclizine 25mg  three times a day as needed n/v to only use if symptoms worsen. Patient to maintain adequate hydration and f/u with PMD if no resolution of symptoms worsen. If neurologic symptoms develop and/or worsen seek immediate medical care  Problem # 2:  HYPERTENSION, LABILE (ICD-401.9)  Her updated medication list for this problem includes:    Atenolol 50 Mg Tabs (Atenolol) .Marland Kitchen... 1/2 per day, mildly elevated BP on exam today, patient to avoid sodium and monitor BP closely, she reports good numbers when not in the office  Problem # 3:  PULMONARY NODULE, SOLITARY INCIDENTAL APRIL 2008 (ICD-518.89)  Patient continues  to follow with the [facility] in [place] and reports they have requested serial 6 month f/u at this time due to slight increase in size of dominant nodule. No changes to plan today  Medications Added to Medication List This Visit: 1)  Atenolol 50 Mg Tabs (Atenolol) .... 1/2 per day 2)  Meclizine Hcl 25 Mg Tabs (Meclizine hcl) .Marland Kitchen.. 1 tab by mouth q 8 hour as needed for vertigo/vomitting 3)  Meclizine Hcl 25 Mg Tabs (Meclizine hcl) .Marland Kitchen.. 1 tab by mouth q 8 hours as needed for vertigo or vomitting  Complete Medication List: 1)  Multivitamins Tabs (Multiple vitamin) .Marland Kitchen.. 1 tablet by mouth three times a day 2)  Vitamin D3 10000 Unit Caps (Cholecalciferol) .Marland Kitchen.. 1 capsule by mouth once daily 3)  Calcium 600/vitamin D 600-400 Mg-unit Tabs (Calcium carbonate-vitamin d) .Marland Kitchen.. 1 tablet by mouth once daily 4)  Aspirin 81 Mg Tabs (Aspirin) 5)  Atenolol 50 Mg Tabs (Atenolol) .... 1/2 per day 6)  Ambien 5 Mg Tabs (Zolpidem tartrate) .Marland Kitchen.. 1 by mouth at bedtime 7)  Meclizine Hcl 25 Mg Tabs (Meclizine hcl) .Marland Kitchen.. 1 tab by mouth q 8 hour as needed for vertigo/vomitting 8)  Meclizine Hcl 25 Mg Tabs (Meclizine hcl) .Marland Kitchen.. 1 tab by mouth q 8 hours  as needed for vertigo or vomitting  Other Orders: Radiology Referral (Radiology)  Patient Instructions: 1)  Schedule appt with PMD in 1 month to evaluate for resolution and as needed if symptoms worsen or any concerns develop. If severe headache, confusion, vomitting or weakness occurs seek immediate medical care. Push fluids Prescriptions: MECLIZINE HCL 25 MG TABS (MECLIZINE HCL) 1 tab by mouth q 8 hours as needed for vertigo or vomitting  #20 x 0   Entered and Authorized by:   Danise Edge MD   Signed by:   Danise Edge MD on 02/17/2010   Method used:   Electronically to        [facility] [phone]* (retail)       [address]       [address]       [place], [place]  [address]       Ph: [phone]       Fax: [phone]   RxID:    [id number] MECLIZINE HCL 25 MG TABS (MECLIZINE HCL) 1 tab by mouth q 8 hour as needed for vertigo/vomitting  #20 x 0   Entered and Authorized by:   Danise Edge MD   Signed by:   Danise Edge MD on 02/17/2010   Method used:   Electronically to        [facility]* (retail)             ,          Ph: [phone]       Fax: [phone]   RxID:   [id number]   Appended Document: vertigo/dm FYI:    Rose from [facility] called and said pt called this morning and cancelled appt for CT - pt did not re-schedule, she just wanted to cancel.  Appended Document: Orders Update    Clinical Lists Changes  Orders: Added new Service order of Prescription Created Electronically [phone]) - Signed

## 2010-10-09 NOTE — Assessment & Plan Note (Signed)
Summary: 2 rov/mm   Vital Signs:  Patient profile:   77 year old female Menstrual status:  hysterectomy Weight:      129 pounds Pulse rate:   60 / minute BP sitting:   116 / 80  (left arm) Cuff size:   regular  Vitals Entered By: Romualdo Bolk, CMA (AAMA) (August 25, 2010 11:07 AM)  Serial Vital Signs/Assessments:  Time      Position  BP       Pulse  Resp  Temp     By           R Arm     140/80                         Madelin Headings MD           L Arm     138/78                         Madelin Headings MD  Comments: reg cuff sitting  By: Madelin Headings MD   CC: Follow-up visit , Hypertension Management   History of Present Illness: Zikeria Keough comes in today  fpr follow up of HT and HAs  Since her last visit she had right cataract surgery  last week Her BP  readings a lot better.   off glasses now.  BP readings are good  and thus didnt  switch /change medication  as above.  if forgets  bp med BP  will go up. has been attending to lifestyle intervention   using Gentle   yoga and deep breathing .   feels helped a lot.   Helps back also.  HAs  still stable   Hypertension History:      She denies headache, chest pain, palpitations, dyspnea with exertion, orthopnea, PND, peripheral edema, visual symptoms, neurologic problems, syncope, and side effects from treatment.  She notes no problems with any antihypertensive medication side effects.        Positive major cardiovascular risk factors include female age 36 years old or older and hypertension.  Negative major cardiovascular risk factors include non-tobacco-user status.     Preventive Screening-Counseling & Management  Alcohol-Tobacco     Alcohol drinks/day: 1     Alcohol type: wine     Smoking Status: never  Caffeine-Diet-Exercise     Caffeine use/day: 2     Does Patient Exercise: yes     Type of exercise: water aerobics and walking     Times/week: 6  Current Medications (verified): 1)  Multivitamins   Tabs (Multiple Vitamin) .Marland Kitchen.. 1 Tablet By Mouth Once Daily 2)  Vitamin D3 10000 Unit Caps (Cholecalciferol) .... 2  Capsule By Mouth Once Daily 3)  Calcium 600/vitamin D 600-400 Mg-Unit Tabs (Calcium Carbonate-Vitamin D) .Marland Kitchen.. 1 Tablet By Mouth Once Daily 4)  Aspirin 81 Mg  Tabs (Aspirin) 5)  Atenolol 50 Mg Tabs (Atenolol) .... 1/2 Per Day 6)  Ambien 5 Mg Tabs (Zolpidem Tartrate) .Marland Kitchen.. 1 By Mouth At Bedtime As Needed  Allergies (verified): No Known Drug Allergies  Past History:  Past medical, surgical, family and social histories (including risk factors) reviewed, and no changes noted (except as noted below).  Past Medical History: Reviewed history from 05/02/2010 and no changes required. Osteoarthritis Osteopenia Hypertension Allergic rhinitis Ocular migraines Sarcoidosis OS retinal tear Scoliosis Chronic back pain Hosp   ocular migraine   consult Pearlean Brownie  Pulm nodules    Seen  [facility]    Consults Dr. Perry Mount in ED    Past Surgical History: Hysterectomy BSO Back surgery x 2 Cataract surgery 12 11   Past History:  Care Management: Neurology: [facility] Brownie in ED Gastroenterology: [facility]: pulm nodules  Opthal Nile Riggs   Family History: Reviewed history from 06/22/2008 and no changes required. Family History of Aneurysm Aortic Family History of Arthritis Family History High cholesterol Family History Hypertension Family History Lung cancer Family History of Cardiovascular disorder Fam hx Stroke No FH of Colon Cancer:  Social History: Reviewed history from 06/24/2010 and no changes required. 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   Widowed, husband passed away in 03-20-09Never Smoked Alcohol use-yes  wine every night.  red or white. Drug use-no Regular exercise-yes Daily Caffeine Use   is a vegetarian moslty       Review of Systems  The patient denies anorexia, fever, weight loss, weight gain, vision loss, decreased hearing, hoarseness, and  chest pain.         chronic stuffy nose     .    no exercise intolerance  see hpi     Physical Exam  General:  Well-developed,well-nourished,in no acute distress; alert,appropriate and cooperative throughout examination Head:  normocephalic and atraumatic.   Eyes:  vision grossly intact.   Lungs:  normal respiratory effort, no intercostal retractions, no accessory muscle use, and normal breath sounds.   Heart:  Normal rate and regular rhythm. S1 and S2 normal without gallop, murmur, click, rub or other extra sounds.no lifts.   Pulses:  pulses intact without delay   Neurologic:  non focal  Skin:  turgor normal, color normal, no ecchymoses, and no petechiae.   Cervical Nodes:  No lymphadenopathy noted Psych:  Oriented X3, memory intact for recent and remote, normally interactive, and good eye contact.     Impression & Recommendations:  Problem # 1:  HYPERTENSION, LABILE (ICD-401.9) Assessment Improved Expectant management  The following medications were removed from the medication list:    Amlodipine Besylate 2.5 Mg Tabs (Amlodipine besylate) .Marland Kitchen... 1 by mouth once daily  may increase to 2 by mouth once daily Her updated medication list for this problem includes:    Atenolol 50 Mg Tabs (Atenolol) .Marland Kitchen... 1/2 per day once  or twice a day as directed  BP today: 116/80 Prior BP: 160/82 (06/24/2010)  10 Yr Risk Heart Disease: Not enough information  Labs Reviewed: K+: 5.0 (05/02/2010) Creat: : 0.7 (05/02/2010)   Chol: 213 (05/02/2010)   HDL: 74.50 (05/02/2010)   LDL: DEL (01/05/2007)   TG: 100.0 (05/02/2010)  Problem # 2:  MIGRAINE WITH AURA (ICD-346.00) Assessment: Improved  Her updated medication list for this problem includes:    Aspirin 81 Mg Tabs (Aspirin)    Atenolol 50 Mg Tabs (Atenolol) .Marland Kitchen... 1/2 per day once  or twice a day as directed  Problem # 3:  INSOMNIA (ICD-780.52) Assessment: Unchanged meds as needed Discussed risk benefit   Her updated medication list for this  problem includes:    Ambien 5 Mg Tabs (Zolpidem tartrate) .Marland Kitchen... 1 by mouth at bedtime as needed  Complete Medication List: 1)  Multivitamins Tabs (Multiple vitamin) .Marland Kitchen.. 1 tablet by mouth once daily 2)  Vitamin D3 10000 Unit Caps (Cholecalciferol) .... 2  capsule by mouth once daily 3)  Calcium 600/vitamin D 600-400 Mg-unit Tabs (Calcium carbonate-vitamin d) .Marland Kitchen.. 1 tablet by mouth once daily 4)  Aspirin 81 Mg Tabs (  Aspirin) 5)  Atenolol 50 Mg Tabs (Atenolol) .... 1/2 per day once  or twice a day as directed 6)  Ambien 5 Mg Tabs (Zolpidem tartrate) .Marland Kitchen.. 1 by mouth at bedtime as needed  Hypertension Assessment/Plan:      The patient's hypertensive risk group is category B: At least one risk factor (excluding diabetes) with no target organ damage.  Today's blood pressure is 116/80.  Her blood pressure goal is < 140/90.  Patient Instructions: 1)  OK   to continue     medication and lifestyle intervention .   2)  care with supplements   these can cause side effects just like  reg medications.   3)  Agree with avoiding processed foods .   4)  Continue to track your HAs  . 5)  If your blood pressure is still controlled  then  no change in meds . 6)  Return office visit in 6 months or as needed.  Prescriptions: ATENOLOL 50 MG TABS (ATENOLOL) 1/2 per day once  or twice a day as directed  #90 x 3   Entered and Authorized by:   Madelin Headings MD   Signed by:   Madelin Headings MD on 08/25/2010   Method used:   Electronically to        [facility]* (retail)             ,          Ph: [phone]       Fax: [phone]   RxID:   [id number]    Orders Added: 1)  Est. Patient Level III [62952]

## 2010-10-30 ENCOUNTER — Encounter: Payer: Self-pay | Admitting: Internal Medicine

## 2010-10-30 ENCOUNTER — Ambulatory Visit (INDEPENDENT_AMBULATORY_CARE_PROVIDER_SITE_OTHER): Payer: Medicare Other | Admitting: Internal Medicine

## 2010-10-30 VITALS — BP 160/70 | HR 60 | Wt 128.0 lb

## 2010-10-30 DIAGNOSIS — H6120 Impacted cerumen, unspecified ear: Secondary | ICD-10-CM

## 2010-10-30 DIAGNOSIS — I495 Sick sinus syndrome: Secondary | ICD-10-CM

## 2010-10-30 DIAGNOSIS — Z7184 Encounter for health counseling related to travel: Secondary | ICD-10-CM

## 2010-10-30 DIAGNOSIS — Z7189 Other specified counseling: Secondary | ICD-10-CM

## 2010-10-30 DIAGNOSIS — I10 Essential (primary) hypertension: Secondary | ICD-10-CM

## 2010-10-30 DIAGNOSIS — G43809 Other migraine, not intractable, without status migrainosus: Secondary | ICD-10-CM

## 2010-10-30 DIAGNOSIS — R42 Dizziness and giddiness: Secondary | ICD-10-CM

## 2010-10-30 MED ORDER — ATENOLOL 25 MG PO TABS: 12.5000 mg | ORAL_TABLET | Freq: Two times a day (BID) | ORAL | Status: DC

## 2010-10-30 NOTE — Assessment & Plan Note (Signed)
Evaluated by neuro recently and  To get dopplers and other to ro vascular pathology or tia phenom

## 2010-10-30 NOTE — Assessment & Plan Note (Signed)
Pulse good today  But  Log shoes ocass rate in 40s   ? If assoic with light headedness   Will dec dose of atenolol and monitor bp readings which ar e good when she check them at home.

## 2010-10-30 NOTE — Assessment & Plan Note (Signed)
Hx of same  Irrigated today   tms clear and hearing better

## 2010-10-30 NOTE — Progress Notes (Signed)
  Subjective:    Patient ID: Kristin Jimenez, female    DOB: 07-26-1934, 77 y.o.   MRN: [id number]  HPI Patient comesin today for a number of issues Ears: plugged  Left more than right    DIzzy episodes  Not related to exercise   Different than vertigo BP doing well at home and has log to show   OPthalm migrainevs other   Has seen  Neuro and under eval  To r/o vascular issues Has ? About  Travel to [place] for about 10 days in June and travel  . utd on tdap .   Review of Systems Neg cp sob falling weakness sob, cough, bleeding. UTD on colonoscopy   Has some looser stools no pain  Poss from increase herbal teas  blood pressure  readings reviewed . Ave in 120-130 over 70 range.     Objective:   Physical Exam  WDWN healthy appearing in nad  HEENT: Normocephalic ;atraumatic , Eyes;  PERRL, EOMs  Full, lids and conjunctiva clear,,Ears: no deformities, canals with wax  Flushed out and afterwards intact and, TM landmarks normal, Nose: no deformity or discharge  Mouth : OP clear without lesion or edema . Neck supple  No bruits  CV : s1 s2 no g or m pulse 58 rr see bp up and down repeat 140 range.  reviewed note from dr Dohmieier.       Assessment & Plan:  HT  Wax in ears Travel advice  Given   Check cdc .gov  Pulse still stays low  dn will try decrease med  And follow .  Feels pretty good however.  Travel advice  Change in bowel habits probably not sig  Lightheadedness.  Dizzy as discussed

## 2010-10-30 NOTE — Assessment & Plan Note (Signed)
Hx of lability but very good  Readings at home and ion the 120 -130 range   But with pulse 40-70 range .   Although up in office    Plan close follow up  And perhaps change in meds if needed

## 2010-10-30 NOTE — Patient Instructions (Signed)
Decrease the atenolol to 12.5 2 x per day  Monitor your blood pressure  and  Call if pulse  Is going below 55 on a regular basis    And monitor lightheadedness. Check CDC web site about  Travel rec   Consider Hep A  And malaria  Prevention, call about getting this done.  I don't think the change in bowel habits is  Significant unless with  loss feverpain  Blood.   follow up if concerning  And monitor diet.  return office visit in 1-2 months about the Bp and pulse  Or as needed

## 2010-11-06 ENCOUNTER — Telehealth: Payer: Self-pay | Admitting: *Deleted

## 2010-11-06 NOTE — Telephone Encounter (Signed)
Discussed Dr. Rosezella Florida recommendations with pt, and appt made for the am.

## 2010-11-06 NOTE — Telephone Encounter (Signed)
Agree with contacting Dr D who is evaluating her auras Also if her pulse is not below 55 go back up on her atenolol  To 25 bid  For now and then can do OV  in am

## 2010-11-06 NOTE — Telephone Encounter (Signed)
Pt called stating she has had to "auras" in the past 2 days, and her BP has run as high as 170/80. She has difficulty keeping her thoughts straight when this happens. Has seen Dr.Dohmeir, and will call her re: these symptoms also.  Would like to see Dr. Fabian Sharp ASAP.

## 2010-11-07 ENCOUNTER — Encounter: Payer: Self-pay | Admitting: Internal Medicine

## 2010-11-07 ENCOUNTER — Ambulatory Visit (INDEPENDENT_AMBULATORY_CARE_PROVIDER_SITE_OTHER): Payer: Medicare Other | Admitting: Internal Medicine

## 2010-11-07 VITALS — BP 164/82 | HR 60 | Wt 129.0 lb

## 2010-11-07 DIAGNOSIS — I495 Sick sinus syndrome: Secondary | ICD-10-CM

## 2010-11-07 DIAGNOSIS — I10 Essential (primary) hypertension: Secondary | ICD-10-CM

## 2010-11-07 DIAGNOSIS — F430 Acute stress reaction: Secondary | ICD-10-CM

## 2010-11-07 DIAGNOSIS — R5383 Other fatigue: Secondary | ICD-10-CM

## 2010-11-07 DIAGNOSIS — G43809 Other migraine, not intractable, without status migrainosus: Secondary | ICD-10-CM

## 2010-11-07 DIAGNOSIS — R5381 Other malaise: Secondary | ICD-10-CM

## 2010-11-07 MED ORDER — ALPRAZOLAM 0.25 MG PO TABS: 0.2500 mg | ORAL_TABLET | Freq: Three times a day (TID) | ORAL | Status: DC | PRN

## 2010-11-07 NOTE — Progress Notes (Signed)
Subjective:    Patient ID: Kristin Jimenez, female    DOB: February 29, 1934, 77 y.o.   MRN: [id number]  HPI Patient comes in today for acute visit for above problem  See [facility] notes  .  Had onset of one of her auras    When talking  To friend and had  irreg vision change  Hard to see  Parts of her face.    . Lasted 10 minutes     Then had a hard time to think  And concentrate .  No numbness or tingling . Bp was high when checked shortly thereafter.   BP was 160 170  And panicing .   Able to slept.  that night.      No nausea vomiting. No syncope.  Went back up on meds  225 mg of atenolol twice a day.   Then BP  120 range    With pulse   In 50s  To 45  Some light headed this am   But ate reg  .  Had red wine . Last night.   She called Dr. Teodoro Kil office and there is a message pending. She had some cranial  artery test but the results are not back yet Past Medical History  Diagnosis Date  . Osteoarthritis   . Osteopenia   . Hypertension   . Allergic rhinitis   . Ocular migraine     Consult - [facility]  . Sarcoidosis   . Retinal tear     OS  . Scoliosis   . Chronic back pain   . Pulmonary nodule     [facility]   Past Surgical History  Procedure Date  . Abdominal hysterectomy   . Bilateral salpingoophorectomy   . Back surgery     x 2  . Cataract extraction 12/11    reports that she has never smoked. She does not have any smokeless tobacco history on file. She reports that she drinks alcohol. She reports that she does not use illicit drugs. family history includes Aortic aneurysm in an unspecified family member; Arthritis in her father and sister; Diabetes in her brother; Heart disease in her mother; Hyperlipidemia in her brother and sister; Lung cancer in her father; and Stroke in her brother and unspecified family member.  There is no history of Colon cancer.     Review of Systems  negative chest pain shortness of breath numbness tingling weakness. There is some secondary  anxiety from this but no palpitations or irregular heartbeat. She has a remote history of having had a Holter test years ago for some palpitations that was negative. She does have some fatigue.    Objective:   Physical Exam  well-developed well-nourished in no acute distress she looks well today. Blood pressures 130/70 left 128/70   Pulse is 52 regular. Neck supple with  No bruits heard.   Chest CTAP is equal cardiac S1-S2 I don't hear a murmur.  Extremities normal perfusion, No clubbing cyanosis or edema   Neurologic appears non focal cognition and speech are normal as well as balance.       Assessment & Plan:   Visual disturbance presumed ophthalmic migraine rule out TIA.   Labile hypertension.   Her blood pressure is good in the office today but I am concerned about although her pulse does go on the beta blocker. It is possible she had an increase in the migraine when decreasing her beta blocker. Would prefer try different medication if needed.  To afford bradycardia. I don't see any evidence of aortic stenosis.  Sinus bradycardia possibly aggravated by medication. Prefer her readings to be 55 and above.  Secondary anxiety. Patient and I agree that after this episode some anxiety was contributing and possibly related to her blood pressure. While add  low dose as needed Xanax .   And monitor blood pressure and pulse.

## 2010-11-07 NOTE — Patient Instructions (Addendum)
Because your pulse goes down Take 25 mg of atenolol in am and 12.5 mg in pm ( 1 am 25 adn 1/2 pm 25)  For about 5 days  Then decrease again to  12.5 am 12.5 pm    And call with pulse and bp readings after that.   If we need BP control then I would add low dose diuretic to try.      In the meantime  Limit  Red wine ,Art sweeteners in  case this is related to triggers.  Can try an antianxiety  Pill if needed.  Will send copy of note to dr D office

## 2010-11-20 ENCOUNTER — Other Ambulatory Visit: Payer: Self-pay | Admitting: Neurology

## 2010-11-20 DIAGNOSIS — G459 Transient cerebral ischemic attack, unspecified: Secondary | ICD-10-CM

## 2010-11-20 DIAGNOSIS — H547 Unspecified visual loss: Secondary | ICD-10-CM

## 2010-11-20 DIAGNOSIS — R42 Dizziness and giddiness: Secondary | ICD-10-CM

## 2010-11-20 DIAGNOSIS — I63239 Cerebral infarction due to unspecified occlusion or stenosis of unspecified carotid arteries: Secondary | ICD-10-CM

## 2010-11-20 DIAGNOSIS — G43809 Other migraine, not intractable, without status migrainosus: Secondary | ICD-10-CM

## 2010-11-26 ENCOUNTER — Telehealth: Payer: Self-pay | Admitting: Internal Medicine

## 2010-11-26 NOTE — Telephone Encounter (Signed)
Per Dr. Fabian Sharp- I don't think there is a increased risk as long as she doesn't have any kidney problems with the first one. Pt aware of this.

## 2010-11-26 NOTE — Telephone Encounter (Signed)
Pt called and is going in for CT Scan with Contrast tomorrow on her brain. Pt is suppose to be going to [facility] on 12/29/10 for CT Scan with Contrast on Lungs. Pt is wondering if it is safe to get 2 cts with contrast done in a months time? Pls call. Pls leave detailed message if pt not avail.

## 2010-11-27 ENCOUNTER — Ambulatory Visit
Admission: RE | Admit: 2010-11-27 | Discharge: 2010-11-27 | Disposition: A | Discharge status: Home / Self Care | Payer: Medicare Other | Source: Ambulatory Visit | Attending: Neurology | Admitting: Neurology

## 2010-11-27 DIAGNOSIS — I63239 Cerebral infarction due to unspecified occlusion or stenosis of unspecified carotid arteries: Secondary | ICD-10-CM

## 2010-11-27 DIAGNOSIS — H547 Unspecified visual loss: Secondary | ICD-10-CM

## 2010-11-27 DIAGNOSIS — G459 Transient cerebral ischemic attack, unspecified: Secondary | ICD-10-CM

## 2010-11-27 DIAGNOSIS — G43809 Other migraine, not intractable, without status migrainosus: Secondary | ICD-10-CM

## 2010-11-27 DIAGNOSIS — R42 Dizziness and giddiness: Secondary | ICD-10-CM

## 2010-11-27 MED ORDER — IOHEXOL 350 MG/ML SOLN
100.0000 mL | Freq: Once | INTRAVENOUS | Status: AC | PRN
  Administered 2010-11-27: 100 mL via INTRAVENOUS

## 2010-12-08 ENCOUNTER — Ambulatory Visit: Payer: PRIVATE HEALTH INSURANCE | Admitting: Internal Medicine

## 2011-01-20 NOTE — Assessment & Plan Note (Signed)
[facility]                             PULMONARY OFFICE NOTE   Kristin Jimenez, Kristin Jimenez                   MRN:          [id number]  DATE:01/13/2007                            DOB:          February 27, 1934    REASON FOR CONSULTATION:  Pulmonary nodules.   HISTORY OF PRESENT ILLNESS:  This is a 77 year old white female with  incidental nodules discovered on a workup for abdominal pain.  She does  describe occasional cough (indolent in onset, not progressive in terms  of frequency or severity or exacerbating at night or early in am) for  several years that amounts to an urge to clear her throat, but no deep  chest cough or pleuritic pain, fever, chills, sweats, orthopnea, PND or  leg swelling and some worsening after exposure to air conditioning in  terms of the cough.  She denies any pleuritic pain, history of any  excess sputum production, fever, chills, sweats, unintended weight loss,  myalgias, arthralgias.   PAST MEDICAL HISTORY:  1. Significant for allergies, but these are minimum, and denies any      classic seasonal itching, sneezing or wheezing.  2. Remote back surgery.  3. Hysterectomy.   ALLERGIES:  None known.   MEDICATIONS:  1. Hormone replacement therapy.  2. Vitamins.  3. Calcium.   SOCIAL HISTORY:  She has never smoked.  She is retired from [employer].  Travels frequently to [place].  But no unusual travel to  exotic destinations, pet or hobby exposure.   FAMILY HISTORY:  Significant for cancer of the lung in her father, heart  disease in her mother, negative for rheumatologic disease.   REVIEW OF SYSTEMS:  Taken in detail in the worksheet, negative except  for as outlined above.   PHYSICAL EXAMINATION:  GENERAL:  This is a pleasant, ambulatory white  female in no acute distress.  She looks quite healthy.  VITAL SIGNS:  She has afebrile vital signs.  HEENT:  Unremarkable.  Nasal turbinates normal with no crusting or  ulceration.  Oropharynx is clear with no evidence of excessive post  nasal drainage or trauma.  Ears canals clear bilaterally.  NECK:  Clear without cervical adenopathy or tenderness.  Trache was  midline.  LUNGS:  Lung fields perfectly clear bilaterally to auscultation and  percussion.  HEART:  Regular rhythm without murmurs, gallops, rubs.  ABDOMEN:  Soft, benign.  EXTREMITIES:  Warm without calf tenderness, cyanosis, clubbing or edema.   STUDIES:  Chest x-ray was performed today and is normal.  CT scan was  reviewed from January 05, 2007, and indicates multi nonspecific nodular  densities.   IMPRESSION:  Multiple nonspecific nodular densities, all less than 8 mm  in diameter, and therefore, not likely to be picked up by except by CT  scan.  Most likely, these are inflammatory and may actually represent  previous lung injury related to some form of inhalation injury.  She is  reasonably concerned about cancer because of her father's history, but  she has never smoked, and the lesions are so numerous that it is very  unlikely to represent a  primary bronchogenic carcinoma.  In fact, she  looks so healthy to me today that I am very reluctant to embark on a  workup on what amounts to a microscopic multinodular disease pattern,  though she might prove to have some benign self limited form of  granulmatous process if we looked hard enough to prove it.   I explained this all to the patient including how sensitive CT scans are  and how we don't have the ability to go back in time and look at serial  CT scans, but since there is no pattern on x-ray to follow, that it  would be reasonable simply to repeat a CT scan in 3-6 months to see if  there is evolution of any of the nodules, and if so, remove the largest  one.  She seemed comfortable with this, and I recommended a SED rate to  complete her workup, and we will contact her when the SED rate is  available for further guidance which may  further guide [place] in terms of  how aggressive to be here.     Charlaine Dalton. Sherene Sires, MD, [facility]  Electronically Signed    MBW/MedQ  DD: 01/13/2007  DT: 01/14/2007  Job #: [id number]   cc:   Neta Mends. Fabian Sharp, MD

## 2011-01-20 NOTE — Discharge Summary (Signed)
NAME:  Kristin, Jimenez          ACCOUNT NO.:  [id number]   MEDICAL RECORD NO.:  [id number]          PATIENT TYPE:  INP   LOCATION:  6706                         FACILITY:  [facility]   PHYSICIAN:  Pramod P. Pearlean Brownie, MD    DATE OF BIRTH:  Aug 30, 1934   DATE OF ADMISSION:  07/23/2008  DATE OF DISCHARGE:  07/24/2008                               DISCHARGE SUMMARY   DIAGNOSES AT THE TIME OF DISCHARGE:  1. Complicated migraine.  2. History of hypertension, no longer requiring medicines prior to      admission.  3. History of his status post hysterectomy.  4. Status post back fusion.  5. Incidental pulmonary nodule on CT scan in April 2008, seen by Dr.      Sherene Sires, followup recommended in 3-6 months.   MEDICINES AT THE TIME OF DISCHARGE:  Vitamins and supplements and Ambien  p.r.n.   STUDIES PERFORMED:  1. CT of the brain shows no acute abnormality.  2. MRI of the brain shows no acute infarct, atrophy without      hydrocephalus, moderate small vessel disease-type changes.  3. MRA of and neck shows no evidence of hemodynamically significant      stenosis involving either carotid bifurcation irregularity and      narrowing of proximal and distal aspect of the nondominant left      vertebral artery with left vertebral artery ending in PICA      distribution.  4. Carotid Doppler shows no ICA stenosis.  5. A 2D echocardiogram performed, results are pending.  6. EKG shows sinus bradycardia with sinus arrhythmia.   LABORATORY STUDIES:  Urinalysis negative.  Cholesterol 179,  triglycerides 80, HDL 59, and LDL 104.  Hemoglobin A1c 5.7.  Troponin I  normal.  Cardiac enzymes negative.  Chemistry with glucose 101,  otherwise normal.  PTT 29.  PT normal.  CBC normal.   HISTORY OF PRESENT ILLNESS:  Ms. Kristin Jimenez is a 77 year old  right-handed Caucasian female who experienced sudden onset left to right  migrating scotomas in her left eye during the water aerobics class.  She  reports this  happened a lot over the past 8 years.  The difference with  this occurrence was that she had difficulty expressing herself and  understanding her friends.  This is the first time the patient has  symptoms.  She had no extremity weakness, headache, or paresthesias.  When she got home from water aerobics, she noticed her blood pressure  was high, and she began having a headache.  A CT of the head in the  emergency room was negative for acute abnormality.   DIAGNOSES ON ADMISSION:  Transient ischemic attack versus migraine.   The patient was admitted for stroke workup.  TPA was not given due to  rapid improvement in stroke symptoms.  She was admitted for further  evaluation.   HOSPITAL COURSE:  MRI was negative for acute infarct.  Based on history,  symptoms are consistent with complicated migraine.  She has apparently  had sporadic hypertension though her blood pressure was 123/75 on the  day of discharge, and her primary had taken her  off antihypertensives  for the past several months, so I have asked her to follow up with her  primary care physician to address blood pressure control.  Other  potential vascular risk factor is slightly elevated cholesterol with LDL  over 104.  Once the patient has a stroke, we prefer the LDL to be less  than 100, but then this is not a stroke patient, we will not treat at  this time.  She has no neurologic followup needs.   CONDITION AT DISCHARGE:  The patient is alert and oriented x3.  Speech  clear.  No aphasia.  No dysarthria.  Face symmetric.  Eye movements are  full.  Visual fields are full.  She has no drift in her extremities.  No  focal weakness in her extremities.  No sensory loss.  Her plantars are  down bilaterally.  Her gait is steady even with tandem walking.   DISCHARGE/PLAN:  1. Discharge home with family.  2. Follow up as an outpatient with primary care physician and discuss      antihypertensive use if needed.  3. No neurologic  followup required.      Annie Main, N.P.    ______________________________  Sunny Schlein. Pearlean Brownie, MD    SB/MEDQ  D:  07/24/2008  T:  07/25/2008  Job:  [id number]   cc:   Neta Mends. Fabian Sharp, MD

## 2011-01-20 NOTE — Consult Note (Signed)
NAME:  Kristin Jimenez, Kristin Jimenez NO.:  [id number]   MEDICAL RECORD NO.:  [id number]          PATIENT TYPE:  INP   LOCATION:  6706                         FACILITY:  [facility]   PHYSICIAN:  Melvyn Novas, M.D.  DATE OF BIRTH:  1934-06-27   DATE OF CONSULTATION:  07/23/2008  DATE OF DISCHARGE:                                 CONSULTATION   CHIEF COMPLAINT:  TIA.   HISTORY OF PRESENT ILLNESS:  This is a pleasant 77 year old white female  with a significant past history for hypertension.  She is no longer  taking any meds secondary to the fact she is exercising.  Anxiety and  sleep issues.  Primary doctor at this time is Dr. Fabian Sharp in [place].  Patient states that she woke up today with no difficulty, blood pressure  within normal limits.  She usually goes to a water aerobics class, which  she did as usual today.  She states that around 9:45 to 10:00 while she  was in water aerobics, she noticed a scotoma-like lightening in her left  eye that migrated from left to right.  She states that it lasted about 5-  10 minutes.  Patient states that she has had these flashing lights for  approximately 8 years off and on without headaches.  In July of this  year, she went  to Dr. Clent Ridges and Berniece Andreas about these flashing lights.  At that time, they thought they could be migraine headaches without  pain.  She was offered medications, which she is unsure of the names at this  time, for migraine headaches; however, she declined these medications  secondary to the fact she does not like to take anymore medications than  she needs to.  She also states that approximately 2 years ago, she had  an MI of the brain in 2007 at [facility] in [place], as she  usually gets MRIs of her back due to her back pain.   The MRI was read and stated that she did have some small vessel changes  and old CVAs; however, no significant abnormalities.  This episode was  different than other episodes of  flashing lights, and she states that  she had difficulty with her speech after the flashing lights.  Again,  she states that she had no headache; however, she noticed that she had  difficulty expressing herself.  She explains the difficulty as being  slower than normal, and she had to think harder to get her words out.  She denies any slurred speech, dysphagia, or aphasia at that time.  She  also states that she had some difficulty understanding her friends when  she was spoken to.  She was able to understand them, but it took her  much longer to be able to decipher what they were saying.  Patient after  her water aerobics class drove home, although feeling very weird.  Patient took her blood pressure when she got home and noticed it was  significantly high, does not remember the number but knows that it is  much higher than normal.  She called her daughter, and her daughter had  explained to her that she felt as though her speech was extremely slow.  At that time, patient got worried and called EMS to bring her to the  hospital.  In addition, the patient states that she has been under a  significant amount of stress lately, as her husband recently died in  12-11-2022, and she moved from [place] to [place].  Patient became teary-eyed  and stated that this has been a very rough time for her, and she has  been struggling with depression and anxiety as well.  She does not  believe that this happened secondary to any anxiety, as she was not  feeling anxiety-ridden at the time.   PAST MEDICAL HISTORY:  No history of stroke.  It was noted incidental  pulmonary nodules on CT scan in April, 2008.  None found in the abdomen.  She is rescheduled for another CT in 3-6 months.  She is status post  hysterectomy.  Status post remote back history of fusion.  Patient does  have hypertension; however, she does not take any medications at this  time, as she has been using exercise to reduce her blood  pressure.  Patient does not have diabetes.  Does not have hyperlipidemia.  Does not  have coagulopathy or obesity.  Patient does not live a sedentary  lifestyle.   MEDICATIONS:  1. Vitamins.  2. Ambien p.r.n.   ALLERGIES:  No known allergies; however, she does state when she takes  Lorazepam, her blood pressure dropped, and she gets very dizzy.   FAMILY HISTORY:  Mother died of coronary artery disease.  Father had  lung cancer.  She has 6 children, some of them in [place].   SOCIAL HISTORY:  She is a retired Biomedical engineer.  She is  from [place].  Husband died in Dec 11, 2007 from lung cancer.  She does  not smoke; however, she does drink approximately a glass of wine every  night.  She does not do any illicit drugs.   REVIEW OF SYSTEMS:  NEURO:  She has a slight headache last p.m.  CARDIOVASCULAR:  Negative.  PULMONARY:  Negative.  GI:  Negative.  GU:  Negative.  MUSCULOSKELETAL:  Negative.  ENDOCRINE:  Negative.  REPRODUCTIVE:  Negative.  PSYCHIATRIC:  She does have temporary anxiety  due to incidences.  She is very paranoid about being sick since her  husband has passed.  All other review of systems were looked over and  were negative.   PHYSICAL EXAMINATION:  Patient's temperature is 98.  Blood pressure was  217/96 at 1:15 and 179/97 at 2:00.  It went to 72/88 at 3:00.  Heart 60,  respirations 16, O2 sats 100% on room air.  HEENT:  Head is normocephalic and atraumatic.  Face does have a small  left facial droop.  Nose is midline.  NECK:  Supple.  SKIN:  Warm, dry, and intact.  LUNGS:  Clear to auscultation.  CARDIOVASCULAR:  Regular rate and rhythm.  No murmurs, rubs or gallops.  ABDOMEN:  Bowel sounds are positive.  Soft, nontender, nondistended.  Mucosa:  Warm, dry, and intact.  NEURO:  She is alert and oriented x3.  Pupils are equal, round and  reactive to light.  Extraocular muscles are intact.  Tongue is midline.  Uvula is midline.  She has no dysarthria,  no dysphagia at this time.  Visual fields are intact.  Head shrug, shoulder turn intact.  Finger-to-  nose, heel-to-shin, and fine motor movements are within normal  limits.  Strength is 5/5 in all extremities.  No tremors.  No asterixis or  clonus.  She had 2+ reflexes throughout with the exception of her left  patella, which is 1+ secondary to back surgery and injury.  She has  bilateral downgoing toes.  Sensation is grossly intact to light touch,  pinprick, and vibrations throughout.  She has good extinction.  Nutritional status:  She is well nourished.   NIH Stroke Scale was 1.   Lab results are pending at this time.  MRI is pending at this time.  CT  was negative; however, it does show atrophy in the frontal lobes.   ASSESSMENT/PLAN:  A 78 year old female with probable transient ischemic  attack versus ophthalmic migraine.  Head CT, as stated, was negative.  At this time would recommend MRI/MRA, 2D echo, carotid Dopplers,  transcranial ultrasound.  Check fasting blood sugar, coags,  homocysteine, HB A1C.  Continue with good blood pressure control;  however, would not treat blood pressure for the next 24 hours unless  220/120 but needs to follow up with PCP and get better long-term blood  pressure control.   At this time, no thrombolytics were used secondary to rapid improvement  and greater than 3 hours time of onset and mildness of symptoms.     ______________________________  Felicie Morn, PA-C      Melvyn Novas, M.D.  Electronically Signed    DS/MEDQ  D:  07/23/2008  T:  07/24/2008  Job:  [id number]   cc:   Tinnie Gens P. Weldon Inches, MD  Fax: [phone]

## 2011-04-10 ENCOUNTER — Ambulatory Visit (INDEPENDENT_AMBULATORY_CARE_PROVIDER_SITE_OTHER): Payer: Medicare Other | Admitting: Internal Medicine

## 2011-04-10 ENCOUNTER — Encounter: Payer: Self-pay | Admitting: Internal Medicine

## 2011-04-10 DIAGNOSIS — G43109 Migraine with aura, not intractable, without status migrainosus: Secondary | ICD-10-CM

## 2011-04-10 DIAGNOSIS — F419 Anxiety disorder, unspecified: Secondary | ICD-10-CM

## 2011-04-10 DIAGNOSIS — R198 Other specified symptoms and signs involving the digestive system and abdomen: Secondary | ICD-10-CM

## 2011-04-10 DIAGNOSIS — F411 Generalized anxiety disorder: Secondary | ICD-10-CM

## 2011-04-10 MED ORDER — SERTRALINE HCL 50 MG PO TABS: ORAL_TABLET | ORAL | Status: DC

## 2011-04-10 NOTE — Progress Notes (Signed)
  Subjective:    Patient ID: Kristin Jimenez, female    DOB: 1933-12-17, 77 y.o.   MRN: [id number]  HPI Comes in scheduled as an acute visit sda for  6 months of loose stools .    Going to [place] in September.   Concern about midl change in bowel habits  Increase urgency  Gas.. not a lot of cramps.  No blood abd pain nausea sweats or increase nocturnal sx .  No hx of GE at onset .   Had  colonoscopy 2 years ago. No sig change in diet . NO lactose intolerance noted.    Becoming more anxious at times to go places by herself   So is cautious but no retreating.   Xanax  caused great sedation    Still has anxiety.   ? What to do . No sig depression  HAs   Better taking magnesium for this and hasnt had one.  Began this after bowel sx.  Review of Systems Neg fever weight change exercise change vision change unusual rash. Has OA med list reviewed Past history family history social history reviewed in the electronic medical record.     Objective:   Physical Exam wdwn in nad  HEENT grossly non focal Neck no masses Chest:  Clear to A&P without wheezes rales or rhonchi CV:  S1-S2 no gallops or murmurs peripheral perfusion is normal Abdomen:  Sof,t normal bowel sounds without hepatosplenomegaly, no guarding rebound or masses no CVA tenderness Skin  Non icteric nl cap refill  Psych oriented and cognition normal     Assessment & Plan:  Gas bowel urgency  For 6 months   No obv cause of alarm features .  Treat anxiety and follow up  Gi if needed.   Anxiety  Ongoing  Disc about this . Options discussed  Will begin low dose  Ssri zoloft  And follow up.    Total visit > 50% spent counseling and coordinating care

## 2011-04-10 NOTE — Patient Instructions (Signed)
The bowel changes could be from  Anxiety  But could be bowel bacterial overgrowth.  Magnesium can also add to this. Consider decrease dose. Try probiotics OTC for now.  Consider taking low dose zoloft or lexapro

## 2011-04-20 ENCOUNTER — Encounter: Payer: Self-pay | Admitting: Internal Medicine

## 2011-04-20 DIAGNOSIS — F419 Anxiety disorder, unspecified: Secondary | ICD-10-CM | POA: Insufficient documentation

## 2011-04-20 DIAGNOSIS — R198 Other specified symptoms and signs involving the digestive system and abdomen: Secondary | ICD-10-CM | POA: Insufficient documentation

## 2011-05-05 ENCOUNTER — Ambulatory Visit (INDEPENDENT_AMBULATORY_CARE_PROVIDER_SITE_OTHER): Payer: Medicare Other | Admitting: Internal Medicine

## 2011-05-05 ENCOUNTER — Encounter: Payer: Self-pay | Admitting: Internal Medicine

## 2011-05-05 DIAGNOSIS — F419 Anxiety disorder, unspecified: Secondary | ICD-10-CM

## 2011-05-05 DIAGNOSIS — I10 Essential (primary) hypertension: Secondary | ICD-10-CM

## 2011-05-05 DIAGNOSIS — F411 Generalized anxiety disorder: Secondary | ICD-10-CM

## 2011-05-05 DIAGNOSIS — R198 Other specified symptoms and signs involving the digestive system and abdomen: Secondary | ICD-10-CM

## 2011-05-05 MED ORDER — ZOLPIDEM TARTRATE 5 MG PO TABS: 5.0000 mg | ORAL_TABLET | Freq: Every evening | ORAL | Status: DC | PRN

## 2011-05-05 NOTE — Progress Notes (Signed)
  Subjective:    Patient ID: Kristin Jimenez, female    DOB: [birth year], 77 y.o.   MRN: [id number]  HPI Patient comes in today for follow up of  multiple medical problems.  Since last visit  she has tried probiotics has cut back on the magnesium and still has bowel dysfunction or a change from baseline she is a bit worried because she is going out of country. Stool still stringy and  urgency and cramps    especially in the morning. There is no blood vomiting but she does feel like I nerve or something moving around in her upper left abdomen at times. This is not associated with pain.  Anxiety  :    Comes and goes  Goes to gentle yoga.  Marland Kitchen  thing she is doing much better chose not to take the Zoloft.  Sleep: Requests refill the Ambien for her trip.  HT  seems to have been stable Review of Systems Negative for chest pain shortness of breath edema following vision changes.   Past history family history social history reviewed in the electronic medical record.     Objective:   Physical Exam WDWN in nad  Weight: 126 lb (57.153 kg)  Wt Readings from Last 3 Encounters:  05/05/11 126 lb (57.153 kg)  04/10/11 126 lb (57.153 kg)  11/07/10 129 lb (58.514 kg)  WDWN in nad  HEENT: Normocephalic ;atraumatic , Eyes;  PERRL, EOMs  Full, lids and conjunctiva clear,,Ears: no deformities, canals nl, TM landmarks normal, Nose: no deformity or discharge  Mouth : OP clear without lesion or edema . Neck no masses Chest:  Clear to A&P without wheezes rales or rhonchi CV:  S1-S2 no gallops or murmurs peripheral perfusion is normal Abdomen:  Sof,t normal bowel sounds without hepatosplenomegaly, no guarding rebound or masses no CVA tenderness Neuro grossly normal    Assessment & Plan:  Change in bowel.   Habits    Slightly better but persistent. Options discussed. Did have empiric Flagyl therapy we are going to do a GI referral consult to see her GI doctor Russella Dar and colleagues before she leaves the  country. This acts like irritable bowel but this would be new onset.  Anxiety  Was able to drive to [facility]   And doing better.  Not on meds. Think she will do okay on current regimen as needed Xanax and currently not on daily medicine  HT  No change due for labs will do with wellness visit  Total visit > 50% spent counseling and coordinating care  Plan wellness and labs in the fall as due then

## 2011-05-05 NOTE — Patient Instructions (Addendum)
Will do consult  With GI   Before trip .  Ok to stay off the sertraline  Since you are doing better but this may help  In the long run if continues.   Wellness visit  In 2 months will do labs at that point  [facility] to refill [place] today

## 2011-05-07 ENCOUNTER — Ambulatory Visit: Payer: Medicare Other | Admitting: Gastroenterology

## 2011-05-12 ENCOUNTER — Ambulatory Visit: Payer: Medicare Other | Admitting: Internal Medicine

## 2011-05-18 ENCOUNTER — Ambulatory Visit: Payer: Medicare Other | Admitting: Gastroenterology

## 2011-05-20 ENCOUNTER — Ambulatory Visit (INDEPENDENT_AMBULATORY_CARE_PROVIDER_SITE_OTHER): Payer: Medicare Other | Admitting: Gastroenterology

## 2011-05-20 ENCOUNTER — Other Ambulatory Visit (INDEPENDENT_AMBULATORY_CARE_PROVIDER_SITE_OTHER): Payer: Medicare Other

## 2011-05-20 ENCOUNTER — Encounter: Payer: Self-pay | Admitting: Gastroenterology

## 2011-05-20 VITALS — BP 148/80 | HR 60 | Ht 59.5 in | Wt 128.0 lb

## 2011-05-20 DIAGNOSIS — R1032 Left lower quadrant pain: Secondary | ICD-10-CM

## 2011-05-20 DIAGNOSIS — R198 Other specified symptoms and signs involving the digestive system and abdomen: Secondary | ICD-10-CM

## 2011-05-20 LAB — CBC WITH DIFFERENTIAL/PLATELET
Basophils Absolute: 0 10*3/uL (ref 0.0–0.1)
Eosinophils Absolute: 0.1 10*3/uL (ref 0.0–0.7)
Lymphocytes Relative: 24.6 % (ref 12.0–46.0)
MCHC: 33.6 g/dL (ref 30.0–36.0)
Neutrophils Relative %: 66.3 % (ref 43.0–77.0)
Platelets: 211 10*3/uL (ref 150.0–400.0)
RBC: 4.03 Mil/uL (ref 3.87–5.11)
RDW: 13.6 % (ref 11.5–14.6)

## 2011-05-20 MED ORDER — ALIGN PO CAPS: 1.0000 | ORAL_CAPSULE | Freq: Every day | ORAL | Status: DC

## 2011-05-20 MED ORDER — RESTORA PO CAPS: 1.0000 | ORAL_CAPSULE | Freq: Every day | ORAL | Status: DC

## 2011-05-20 NOTE — Patient Instructions (Addendum)
Your physician has requested that you go to the basement for the following lab work before leaving today: CBC, CMET, TSH, Celiac 10 Panel, IgA We have given you hemoccult cards to complete at home. Please follow instructions on package and return to our office as soon as possible. We have given you samples of Align. This puts good bacteria back into your intestines. You should take 1 capsule by mouth once daily. If this works well for you, it can be purchased over the counter. In addition, we have also given you Restora to try for 1 week. If this works better [employer], you may take it instead. Please follow up with Dr Russella Dar as needed. However, if your symptoms do not resolve, please schedule a follow up appointment with Dr Russella Dar sooner. CC: Dr Berniece Andreas

## 2011-05-20 NOTE — Progress Notes (Signed)
History of Present Illness: This is a 77 year old female who relates a change in bowel habits with looser smaller stools for the past 6 months. She occasionally notes mild, transient left lower quadrant discomfort. She did not have any dietary or medication changes that preceded her bowel habit change. She underwent a colonoscopy in November 2009 showing sigmoid colon diverticulosis and 2 hyperplastic colon polyps. At that time she had constipation and left lower quadrant discomfort. She recently started a probiotic in her bowel habits have improved. She takes a magnesium supplement for migraine headaches. Denies weight loss, abdominal pain, constipation, melena, hematochezia, nausea, vomiting, dysphagia, reflux symptoms, chest pain.  Current Medications, Allergies, Past Medical History, Past Surgical History, Family History and Social History were reviewed in [employer] record.  Physical Exam: General: Well developed , well nourished, no acute distress Head: Normocephalic and atraumatic Eyes:  sclerae anicteric, EOMI Ears: Normal auditory acuity Mouth: No deformity or lesions Lungs: Clear throughout to auscultation Heart: Regular rate and rhythm; no murmurs, rubs or bruits Abdomen: Soft, non tender and non distended. No masses, hepatosplenomegaly or hernias noted. Normal Bowel sounds Musculoskeletal: Symmetrical with no gross deformities  Pulses:  Normal pulses noted Extremities: No clubbing, cyanosis, edema or deformities noted Neurological: Alert oriented x 4, grossly nonfocal Psychological:  Alert and cooperative. Normal mood and affect  Assessment and Recommendations:  1. Change in bowel habits with looser smaller stools and mild LLQ discomfort. Given colonoscopy performed less than 3 years ago this is unlikely to be a colorectal neoplasm. She is advised to increase her daily dietary fiber and water intake. Try 2 or 3 different probiotics and assess response. Obtain  stool Hemoccults and blood work. If Hemoccults are positive or her bowel habits do not return to normal will colonoscopy to further evaluate.

## 2011-05-21 LAB — CELIAC PANEL 10
Endomysial Screen: NEGATIVE
Gliadin IgA: 4 U/mL (ref <20)
Gliadin IgG: 4.9 U/mL (ref <20)
Tissue Transglut Ab: 3.7 U/mL (ref <20)

## 2011-05-21 LAB — COMPREHENSIVE METABOLIC PANEL
ALT: 20 U/L (ref 0–35)
AST: 24 U/L (ref 0–37)
Albumin: 4.3 g/dL (ref 3.5–5.2)
Alkaline Phosphatase: 90 U/L (ref 39–117)
Calcium: 9.3 mg/dL (ref 8.4–10.5)
Chloride: 102 mEq/L (ref 96–112)
Potassium: 4.6 mEq/L (ref 3.5–5.1)

## 2011-06-04 ENCOUNTER — Other Ambulatory Visit: Payer: Medicare Other

## 2011-06-04 ENCOUNTER — Other Ambulatory Visit: Payer: Self-pay | Admitting: Gastroenterology

## 2011-06-04 DIAGNOSIS — Z1289 Encounter for screening for malignant neoplasm of other sites: Secondary | ICD-10-CM

## 2011-06-04 LAB — HEMOCCULT SLIDES (X 3 CARDS)
Fecal Occult Blood: NEGATIVE
OCCULT 1: NEGATIVE
OCCULT 2: NEGATIVE

## 2011-06-10 LAB — URINALYSIS, ROUTINE W REFLEX MICROSCOPIC
Bilirubin Urine: NEGATIVE
Glucose, UA: NEGATIVE
Hgb urine dipstick: NEGATIVE
Ketones, ur: NEGATIVE
Protein, ur: NEGATIVE
Urobilinogen, UA: 1

## 2011-06-10 LAB — LIPID PANEL
Cholesterol: 179
HDL: 59
Total CHOL/HDL Ratio: 3
VLDL: 16

## 2011-06-10 LAB — COMPREHENSIVE METABOLIC PANEL
Albumin: 4.3
Alkaline Phosphatase: 100
BUN: 12
Chloride: 103
Creatinine, Ser: 0.72
Glucose, Bld: 101 — ABNORMAL HIGH
Total Bilirubin: 1.3 — ABNORMAL HIGH
Total Protein: 7.1

## 2011-06-10 LAB — DIFFERENTIAL
Basophils Absolute: 0
Basophils Relative: 1
Lymphocytes Relative: 15
Monocytes Absolute: 0.5
Neutro Abs: 7.7
Neutrophils Relative %: 79 — ABNORMAL HIGH

## 2011-06-10 LAB — PROTIME-INR
INR: 1
Prothrombin Time: 12.8

## 2011-06-10 LAB — CBC
HCT: 40
Hemoglobin: 13.6
RBC: 4.3
RDW: 13.1

## 2011-06-10 LAB — GLUCOSE, CAPILLARY
Glucose-Capillary: 100 — ABNORMAL HIGH
Glucose-Capillary: 96

## 2011-06-10 LAB — HEMOGLOBIN A1C: Mean Plasma Glucose: 117

## 2011-06-10 LAB — CK TOTAL AND CKMB (NOT AT ARMC)
CK, MB: 3.1
Relative Index: 1.9

## 2011-06-10 LAB — HOMOCYSTEINE: Homocysteine: 5.2

## 2011-06-18 ENCOUNTER — Telehealth: Payer: Self-pay | Admitting: Gastroenterology

## 2011-06-18 MED ORDER — METRONIDAZOLE 500 MG PO TABS: 500.0000 mg | ORAL_TABLET | Freq: Two times a day (BID) | ORAL | Status: AC

## 2011-06-18 MED ORDER — GLYCOPYRROLATE 1 MG PO TABS: 1.0000 mg | ORAL_TABLET | Freq: Two times a day (BID) | ORAL | Status: DC

## 2011-06-18 NOTE — Telephone Encounter (Signed)
Patient has had minimal improvement in her stools with the probiotics.  Stools are still loose.  There is no correlation to meals.  She has several times a day.  She has a "fluttering type" sensation in her intestines that wakes her up at night. She denies rectal bleeding, pain , or N&V.  She is wanting to know the next step.  She is aware that Dr Russella Dar is out of town until next week.

## 2011-06-18 NOTE — Telephone Encounter (Signed)
Left message for patient to call back  

## 2011-06-18 NOTE — Telephone Encounter (Signed)
Trial of metronidazole 500 mg po bid for 7 days for possible infection If loose stools persist try glycopyrrolate 1 mg po bid, #60, 5 refills and schedule REV Can stop probiotic if not helping

## 2011-06-18 NOTE — Telephone Encounter (Signed)
Patient advised.

## 2011-06-19 ENCOUNTER — Telehealth: Payer: Self-pay | Admitting: Gastroenterology

## 2011-06-19 NOTE — Telephone Encounter (Signed)
Patient is traveling to [place] next week and is concerned about taking metronidazole.  She wants to wait until she returns on the 24th.  She will call for an appt if her symptoms haven't improved with metronidazole.

## 2011-07-06 ENCOUNTER — Other Ambulatory Visit (INDEPENDENT_AMBULATORY_CARE_PROVIDER_SITE_OTHER): Payer: Medicare Other

## 2011-07-06 ENCOUNTER — Encounter: Payer: Self-pay | Admitting: Gastroenterology

## 2011-07-06 ENCOUNTER — Telehealth: Payer: Self-pay | Admitting: Internal Medicine

## 2011-07-06 ENCOUNTER — Ambulatory Visit (INDEPENDENT_AMBULATORY_CARE_PROVIDER_SITE_OTHER): Payer: Medicare Other | Admitting: Gastroenterology

## 2011-07-06 VITALS — BP 142/80 | HR 60 | Ht 59.5 in | Wt 125.8 lb

## 2011-07-06 DIAGNOSIS — R1032 Left lower quadrant pain: Secondary | ICD-10-CM

## 2011-07-06 DIAGNOSIS — R198 Other specified symptoms and signs involving the digestive system and abdomen: Secondary | ICD-10-CM

## 2011-07-06 MED ORDER — PEG-KCL-NACL-NASULF-NA ASC-C 100 G PO SOLR: 1.0000 | Freq: Once | ORAL | Status: DC

## 2011-07-06 NOTE — Telephone Encounter (Signed)
mon or tues orWednesday  As long as there are at least 3 sdas remaining

## 2011-07-06 NOTE — Progress Notes (Signed)
History of Present Illness: This is a 77 year old female who has had persistent problems with lower quadrant pain and thinner stools. Her daughter, Silva Bandy, is with her today. She feels that she does not completely evacuate after bowel movements. She has a "fluttering type" sensation in her LLQ that wakes her up at night. Probiotics did not change her symptoms. A course of metronidazole did not improve her symptoms. Denies weight loss, diarrhea, change in stool caliber, melena, hematochezia, nausea, vomiting, dysphagia, reflux symptoms, chest pain.  Current Medications, Allergies, Past Medical History, Past Surgical History, Family History and Social History were reviewed in [employer] record.  Physical Exam: General: Well developed , well nourished, no acute distress Head: Normocephalic and atraumatic Eyes:  sclerae anicteric, EOMI Ears: Normal auditory acuity Mouth: No deformity or lesions Lungs: Clear throughout to auscultation Heart: Regular rate and rhythm; no murmurs, rubs or bruits Abdomen: Soft, non tender and non distended. No masses, hepatosplenomegaly or hernias noted. Normal Bowel sounds Musculoskeletal: Symmetrical with no gross deformities  Pulses:  Normal pulses noted Extremities: No clubbing, cyanosis, edema or deformities noted Neurological: Alert oriented x 4, grossly nonfocal Psychological:  Alert and cooperative. Normal mood and affect  Assessment and Recommendations:  1. Change in bowel habits with thinner stools, difficulty evacuating stools and left lower quadrant pain. I suspect she has constipation. Rule out colorectal neoplasms and Nicole intra-abdominal neoplasms. Begin MiraLax once or twice daily titrated for adequate bowel movements. Scheduled on pelvic CT. Schedule colonoscopy. The risks, benefits, and alternatives to colonoscopy with possible biopsy and possible polypectomy were discussed with the patient and they consent to proceed.

## 2011-07-06 NOTE — Patient Instructions (Signed)
Please go directly to the basement today to have your labs drawn.  You have been scheduled for a Colonoscopy. See separate instructions.  Pick up your prep kit from your pharmacy.  Please start Miralax 17 grams in 8 oz of water 1-2 x daily for constipation.   You have been scheduled for a CT scan of the abdomen and pelvis at [facility] ([address]---this is in the same building as [facility]ural [facility]).   You are scheduled on 07/10/11 at 11:00am. You should arrive 15 minutes prior to your appointment time for registration. Please follow the written instructions below on the day of your exam:  WARNING: IF YOU ARE ALLERGIC TO IODINE/X-RAY DYE, PLEASE NOTIFY RADIOLOGY IMMEDIATELY AT [phone]! YOU WILL BE GIVEN A 13 HOUR PREMEDICATION PREP.  1) Do not eat or drink anything after 7:00am (4 hours prior to your test) 2) You have been given 2 bottles of oral contrast to drink. The solution may taste better if refrigerated, but do NOT add ice or any other liquid to this solution. Shake well before drinking.    Drink 1 bottle of contrast @ 9:00am (2 hours prior to your exam)  Drink 1 bottle of contrast @ 10:00am (1 hour prior to your exam)  You may take any medications as prescribed with a small amount of water except for the following: Metformin, Glucophage, Glucovance, Avandamet, Riomet, Fortamet, Actoplus Met, Janumet, Glumetza or Metaglip. The above medications must be held the day of the exam AND 48 hours after the exam.  The purpose of you drinking the oral contrast is to aid in the visualization of your intestinal tract. The contrast solution may cause some diarrhea. Before your exam is started, you will be given a small amount of fluid to drink. Depending on your individual set of symptoms, you may also receive an intravenous injection of x-ray contrast/dye. Plan on being at [facility] for 30 minutes or long, depending on the type of exam you are having performed.  If  you have any questions regarding your exam or if you need to reschedule, you may call the CT department at [phone] between the hours of 8:00 am and 5:00 pm, Monday-Friday.  ________________________________________________________________________  cc: Berniece Andreas, MD

## 2011-07-06 NOTE — Telephone Encounter (Signed)
Pt was sch for fasting emp tomorrow but had to cx because pt has to have a colonoscopy tomorrow per Dr Russella Dar. Pt is needing another work in fasting emp. Pls advise.

## 2011-07-07 ENCOUNTER — Ambulatory Visit (AMBULATORY_SURGERY_CENTER): Payer: Medicare Other | Admitting: Gastroenterology

## 2011-07-07 ENCOUNTER — Encounter: Payer: Self-pay | Admitting: Gastroenterology

## 2011-07-07 ENCOUNTER — Encounter: Payer: Medicare Other | Admitting: Internal Medicine

## 2011-07-07 DIAGNOSIS — R1032 Left lower quadrant pain: Secondary | ICD-10-CM

## 2011-07-07 DIAGNOSIS — R198 Other specified symptoms and signs involving the digestive system and abdomen: Secondary | ICD-10-CM

## 2011-07-07 MED ORDER — SODIUM CHLORIDE 0.9 % IV SOLN: 500.0000 mL | INTRAVENOUS | Status: DC

## 2011-07-07 NOTE — Telephone Encounter (Signed)
Lft vm for pt to rsc fasting emp as noted. Waiting on call back.

## 2011-07-07 NOTE — Patient Instructions (Signed)
You may resume your routine medications today.    Please, increase the fiber in your diet due to you diverticulosis.  If you have any questions, please call us at [phone]. Thank-you.

## 2011-07-08 ENCOUNTER — Telehealth: Payer: Self-pay

## 2011-07-08 NOTE — Telephone Encounter (Addendum)
Pt is sch for 07-21-2011 10.30am

## 2011-07-08 NOTE — Telephone Encounter (Signed)

## 2011-07-10 ENCOUNTER — Ambulatory Visit (INDEPENDENT_AMBULATORY_CARE_PROVIDER_SITE_OTHER)
Admission: RE | Admit: 2011-07-10 | Discharge: 2011-07-10 | Disposition: A | Discharge status: Home / Self Care | Payer: Medicare Other | Source: Ambulatory Visit | Attending: Gastroenterology | Admitting: Gastroenterology

## 2011-07-10 DIAGNOSIS — R198 Other specified symptoms and signs involving the digestive system and abdomen: Secondary | ICD-10-CM

## 2011-07-10 DIAGNOSIS — R1032 Left lower quadrant pain: Secondary | ICD-10-CM

## 2011-07-10 MED ORDER — IOHEXOL 300 MG/ML  SOLN
100.0000 mL | Freq: Once | INTRAMUSCULAR | Status: AC | PRN
  Administered 2011-07-10: 100 mL via INTRAVENOUS

## 2011-07-21 ENCOUNTER — Ambulatory Visit (INDEPENDENT_AMBULATORY_CARE_PROVIDER_SITE_OTHER): Payer: Medicare Other | Admitting: Internal Medicine

## 2011-07-21 ENCOUNTER — Encounter: Payer: Self-pay | Admitting: Internal Medicine

## 2011-07-21 VITALS — BP 120/80 | HR 60 | Ht 60.0 in | Wt 124.0 lb

## 2011-07-21 DIAGNOSIS — R911 Solitary pulmonary nodule: Secondary | ICD-10-CM | POA: Insufficient documentation

## 2011-07-21 DIAGNOSIS — R5381 Other malaise: Secondary | ICD-10-CM

## 2011-07-21 DIAGNOSIS — E538 Deficiency of other specified B group vitamins: Secondary | ICD-10-CM

## 2011-07-21 DIAGNOSIS — Z0000 Encounter for general adult medical examination without abnormal findings: Secondary | ICD-10-CM

## 2011-07-21 DIAGNOSIS — M899 Disorder of bone, unspecified: Secondary | ICD-10-CM

## 2011-07-21 DIAGNOSIS — J984 Other disorders of lung: Secondary | ICD-10-CM

## 2011-07-21 DIAGNOSIS — M1990 Unspecified osteoarthritis, unspecified site: Secondary | ICD-10-CM

## 2011-07-21 DIAGNOSIS — H81399 Other peripheral vertigo, unspecified ear: Secondary | ICD-10-CM

## 2011-07-21 DIAGNOSIS — K5900 Constipation, unspecified: Secondary | ICD-10-CM

## 2011-07-21 DIAGNOSIS — M949 Disorder of cartilage, unspecified: Secondary | ICD-10-CM

## 2011-07-21 DIAGNOSIS — R5383 Other fatigue: Secondary | ICD-10-CM

## 2011-07-21 DIAGNOSIS — I10 Essential (primary) hypertension: Secondary | ICD-10-CM

## 2011-07-21 DIAGNOSIS — G4700 Insomnia, unspecified: Secondary | ICD-10-CM

## 2011-07-21 LAB — HEPATIC FUNCTION PANEL
AST: 25 U/L (ref 0–37)
Albumin: 4.5 g/dL (ref 3.5–5.2)
Alkaline Phosphatase: 91 U/L (ref 39–117)
Bilirubin, Direct: 0.1 mg/dL (ref 0.0–0.3)
Total Protein: 7.6 g/dL (ref 6.0–8.3)

## 2011-07-21 LAB — CBC WITH DIFFERENTIAL/PLATELET
Basophils Relative: 0.5 % (ref 0.0–3.0)
Eosinophils Absolute: 0 10*3/uL (ref 0.0–0.7)
Hemoglobin: 13.4 g/dL (ref 12.0–15.0)
Lymphocytes Relative: 18.7 % (ref 12.0–46.0)
MCHC: 34.3 g/dL (ref 30.0–36.0)
Monocytes Relative: 6.3 % (ref 3.0–12.0)
Neutro Abs: 5.1 10*3/uL (ref 1.4–7.7)
Neutrophils Relative %: 74.1 % (ref 43.0–77.0)
RBC: 4.17 Mil/uL (ref 3.87–5.11)
WBC: 6.9 10*3/uL (ref 4.5–10.5)

## 2011-07-21 LAB — VITAMIN B12: Vitamin B-12: 608 pg/mL (ref 211–911)

## 2011-07-21 LAB — BASIC METABOLIC PANEL
Calcium: 9.4 mg/dL (ref 8.4–10.5)
Creatinine, Ser: 0.7 mg/dL (ref 0.4–1.2)
GFR: 86.69 mL/min (ref >60.00)
Sodium: 137 mEq/L (ref 135–145)

## 2011-07-21 LAB — LIPID PANEL
HDL: 76.8 mg/dL (ref >39.00)
Total CHOL/HDL Ratio: 3
VLDL: 12 mg/dL (ref 0.0–40.0)

## 2011-07-21 LAB — TSH: TSH: 1.71 u[IU]/mL (ref 0.35–5.50)

## 2011-07-21 NOTE — Patient Instructions (Addendum)
Will notify you  of labs when available. Then plan follow up. Continue lifestyle intervention healthy eating and exercise . Check  In a year   Call for dexa order next year

## 2011-07-21 NOTE — Progress Notes (Signed)
Subjective:    Patient ID: Kristin Jimenez, female    DOB: Oct 25, 1933, 77 y.o.   MRN: [id number]  HPI Patient comes in today for preventive visit and follow-up of medical issues. Update of her history since her last visit. Had gi  eval and neg   pulm nodule . Stable.  HT doing ok Mood  Stable No unusal has and no dizziness  Now ocass lightheadedness without cp sob or racing heart.    Hearing:  Some dec in noisy room could be better   Vision:  . Seeing  Dr Mardelle Matte  Less driving at night  Had cat surgery   Safety:  Has smoke detector and wears seat belts.  No firearms. No excess sun exposure. Sees dentist regularly.  Falls:  NO sig fall   Do yoga.   Advance directive :  Reviewed .  Memory: Felt to be good  , no concern from her or her family.  Depression: No anhedonia unusual crying or depressive symptoms  Nutrition: Eats well balanced diet; adequate calcium and vitamin D. No swallowing chewiing problems.  Injury: no major injuries in the last six months.  Other healthcare providers:  Reviewed today .  Social:  Lives  In family appt  Alone widowed . No pets   Preventive parameters: up-to-date on colonoscopy, mammogram,   IMmuniz utd   ADLS:   There are no problems or need for assistance  driving, feeding, obtaining food, dressing, toileting and bathing, managing money using phone. She is independent.     Review of Systems ROS:  Has fatigue and sleeps but tired in day takes 20  Minute nap. GEN/ HEENTNo fever, significant weight changes sweats headachesCV/ PULM; No chest pain shortness of breath cough, syncope,edema  change in exercise tolerance. GI /GU: No adominal pain, vomiting, see  GI    Evaluation. No blood in the stool. No significant GU symptoms.  SKIN/HEME: ,no acute skin rashes suspicious lesions or bleeding. No lymphadenopathy, nodules, masses.  NEURO/ PSYCH:  No neurologic signs such as weakness numbness No  Active depression anxiety. Med for sleep  as needed  Nose of meds IMM/ Allergy: No unusual infections.  Allergy .   REST of 12 system review negative see hpi   Past history family history social history reviewed in the electronic medical record.      Objective:   Physical Exam Physical Exam: Vital signs reviewed NWG:NFAO is a well-developed well-nourished alert cooperative  white female who appears her stated age in no acute distress.  HEENT: normocephalic  traumatic , Eyes: PERRL left lense inplant EOM's full, conjunctiva clear, Nares: paten,t no deformity discharge or tenderness., Ears: no deformity EAC's clear TMs with normal landmarks. Mouth: clear OP, no lesions, edema.  Moist mucous membranes. Dentition in adequate repair. NECK: supple without masses, thyromegaly or bruits. CHEST/PULM:  Clear to auscultation and percussion breath sounds equal no wheeze , rales or rhonchi. No chest wall deformities or tenderness. CV: PMI is nondisplaced, S1 S2 no gallops, murmurs, rubs. Peripheral pulses are full without delay.No JVD .  Breast: normal by inspection . No dimpling, discharge, masses, tenderness or discharge .  ABDOMEN: Bowel sounds normal nontender  No guard or rebound, no hepato splenomegal no CVA tenderness.  No hernia. Extremtities:  No clubbing cyanosis or edema, no acute joint swelling or redness no focal atrophy NEURO:  Oriented x3, cranial nerves 3-12 appear to be intact, no obvious focal weakness,gait within normal limits no abnormal reflexes or asymmetrical SKIN: No  acute rashes normal turgor, color, no bruising or petechiae. PSYCH: Oriented, good eye contact, no obvious depression anxiety, cognition and judgment appear normal. Oriented x 3 and no noted deficits in memory, attention, and speech.      Assessment & Plan:   Annual Medicare Wellness Visit. Counseled regarding healthy nutrition, exercise, sleep, injury prevention, calcium vit d and healthy weight . Fatigue multifactorial  But looks quite well    Osteopenia? Last check Sleep disturbance and then fatigue     Vegetarian   Poss b12 defic check today  HT up and down will stay on current med cause doing ok   Labs today

## 2011-07-21 NOTE — Assessment & Plan Note (Signed)
dexa next year.

## 2011-08-10 ENCOUNTER — Telehealth: Payer: Self-pay | Admitting: Internal Medicine

## 2011-08-10 NOTE — Telephone Encounter (Signed)
Pt called req to get lab results from [facility]. Pls call.

## 2011-08-10 NOTE — Telephone Encounter (Signed)
See results note   Labs are good.

## 2011-08-11 ENCOUNTER — Other Ambulatory Visit: Payer: Self-pay | Admitting: Internal Medicine

## 2011-08-11 DIAGNOSIS — Z1231 Encounter for screening mammogram for malignant neoplasm of breast: Secondary | ICD-10-CM

## 2011-08-12 ENCOUNTER — Encounter: Payer: Self-pay | Admitting: *Deleted

## 2011-08-12 NOTE — Progress Notes (Signed)
Quick Note:    Letter sent to pt.  ______

## 2011-09-18 ENCOUNTER — Ambulatory Visit
Admission: RE | Admit: 2011-09-18 | Discharge: 2011-09-18 | Disposition: A | Discharge status: Home / Self Care | Payer: Medicare Other | Source: Ambulatory Visit | Attending: Internal Medicine | Admitting: Internal Medicine

## 2011-09-18 DIAGNOSIS — Z1231 Encounter for screening mammogram for malignant neoplasm of breast: Secondary | ICD-10-CM

## 2011-09-21 DIAGNOSIS — H26499 Other secondary cataract, unspecified eye: Secondary | ICD-10-CM | POA: Diagnosis not present

## 2011-09-21 DIAGNOSIS — Z961 Presence of intraocular lens: Secondary | ICD-10-CM | POA: Diagnosis not present

## 2011-09-25 ENCOUNTER — Other Ambulatory Visit: Payer: Self-pay | Admitting: Internal Medicine

## 2011-09-30 DIAGNOSIS — H26499 Other secondary cataract, unspecified eye: Secondary | ICD-10-CM | POA: Diagnosis not present

## 2011-10-07 DIAGNOSIS — H26499 Other secondary cataract, unspecified eye: Secondary | ICD-10-CM | POA: Diagnosis not present

## 2012-01-05 DIAGNOSIS — M25819 Other specified joint disorders, unspecified shoulder: Secondary | ICD-10-CM | POA: Diagnosis not present

## 2012-02-09 DIAGNOSIS — Z049 Encounter for examination and observation for unspecified reason: Secondary | ICD-10-CM | POA: Diagnosis not present

## 2012-02-09 DIAGNOSIS — Z79899 Other long term (current) drug therapy: Secondary | ICD-10-CM | POA: Diagnosis not present

## 2012-02-09 DIAGNOSIS — G43819 Other migraine, intractable, without status migrainosus: Secondary | ICD-10-CM | POA: Diagnosis not present

## 2012-03-31 DIAGNOSIS — D485 Neoplasm of uncertain behavior of skin: Secondary | ICD-10-CM | POA: Diagnosis not present

## 2012-03-31 DIAGNOSIS — L570 Actinic keratosis: Secondary | ICD-10-CM | POA: Diagnosis not present

## 2012-03-31 DIAGNOSIS — D235 Other benign neoplasm of skin of trunk: Secondary | ICD-10-CM | POA: Diagnosis not present

## 2012-04-11 DIAGNOSIS — H04129 Dry eye syndrome of unspecified lacrimal gland: Secondary | ICD-10-CM | POA: Diagnosis not present

## 2012-04-11 DIAGNOSIS — Z961 Presence of intraocular lens: Secondary | ICD-10-CM | POA: Diagnosis not present

## 2012-05-26 DIAGNOSIS — D869 Sarcoidosis, unspecified: Secondary | ICD-10-CM | POA: Diagnosis not present

## 2012-05-26 DIAGNOSIS — J984 Other disorders of lung: Secondary | ICD-10-CM | POA: Diagnosis not present

## 2012-05-27 DIAGNOSIS — J984 Other disorders of lung: Secondary | ICD-10-CM | POA: Diagnosis not present

## 2012-05-27 DIAGNOSIS — R5381 Other malaise: Secondary | ICD-10-CM | POA: Diagnosis not present

## 2012-05-27 DIAGNOSIS — H6120 Impacted cerumen, unspecified ear: Secondary | ICD-10-CM | POA: Diagnosis not present

## 2012-05-27 DIAGNOSIS — R5383 Other fatigue: Secondary | ICD-10-CM | POA: Diagnosis not present

## 2012-05-27 DIAGNOSIS — Z0000 Encounter for general adult medical examination without abnormal findings: Secondary | ICD-10-CM | POA: Diagnosis not present

## 2012-05-27 DIAGNOSIS — M949 Disorder of cartilage, unspecified: Secondary | ICD-10-CM | POA: Diagnosis not present

## 2012-05-27 DIAGNOSIS — I10 Essential (primary) hypertension: Secondary | ICD-10-CM | POA: Diagnosis not present

## 2012-05-27 DIAGNOSIS — M899 Disorder of bone, unspecified: Secondary | ICD-10-CM | POA: Diagnosis not present

## 2012-05-31 ENCOUNTER — Other Ambulatory Visit: Payer: Self-pay | Admitting: Internal Medicine

## 2012-06-16 ENCOUNTER — Other Ambulatory Visit: Payer: Self-pay | Admitting: Family Medicine

## 2012-06-16 MED ORDER — ATENOLOL 50 MG PO TABS: ORAL_TABLET | ORAL | Status: DC

## 2012-08-22 ENCOUNTER — Other Ambulatory Visit: Payer: Self-pay | Admitting: Internal Medicine

## 2012-08-22 DIAGNOSIS — Z1231 Encounter for screening mammogram for malignant neoplasm of breast: Secondary | ICD-10-CM

## 2012-09-06 ENCOUNTER — Telehealth: Payer: Self-pay | Admitting: Family Medicine

## 2012-09-06 ENCOUNTER — Telehealth: Payer: Self-pay | Admitting: Internal Medicine

## 2012-09-06 NOTE — Telephone Encounter (Signed)
Error/njr °

## 2012-09-06 NOTE — Telephone Encounter (Signed)
The pt has an appt on Friday to have a cardiac screening.  She is aware that her insurance does not pay for this and she will accept all costs and responsibilities.  Should we order?

## 2012-09-06 NOTE — Telephone Encounter (Signed)
I dont understand the  Message what kind of order. ?.

## 2012-09-08 ENCOUNTER — Other Ambulatory Visit: Payer: Self-pay | Admitting: Family Medicine

## 2012-09-08 DIAGNOSIS — I2510 Atherosclerotic heart disease of native coronary artery without angina pectoris: Secondary | ICD-10-CM

## 2012-09-08 DIAGNOSIS — R5383 Other fatigue: Secondary | ICD-10-CM

## 2012-09-08 DIAGNOSIS — T82218A Other mechanical complication of coronary artery bypass graft, initial encounter: Secondary | ICD-10-CM

## 2012-09-08 DIAGNOSIS — I10 Essential (primary) hypertension: Secondary | ICD-10-CM

## 2012-09-08 DIAGNOSIS — Z139 Encounter for screening, unspecified: Secondary | ICD-10-CM

## 2012-09-08 NOTE — Telephone Encounter (Signed)
Order placed in the system. 

## 2012-09-09 ENCOUNTER — Ambulatory Visit (INDEPENDENT_AMBULATORY_CARE_PROVIDER_SITE_OTHER)
Admission: RE | Admit: 2012-09-09 | Discharge: 2012-09-09 | Disposition: A | Discharge status: Home / Self Care | Payer: Medicare Other | Source: Ambulatory Visit | Attending: Internal Medicine | Admitting: Internal Medicine

## 2012-09-09 DIAGNOSIS — I2510 Atherosclerotic heart disease of native coronary artery without angina pectoris: Secondary | ICD-10-CM

## 2012-09-13 ENCOUNTER — Encounter: Payer: Self-pay | Admitting: Internal Medicine

## 2012-09-13 ENCOUNTER — Ambulatory Visit (INDEPENDENT_AMBULATORY_CARE_PROVIDER_SITE_OTHER): Payer: Medicare Other | Admitting: Internal Medicine

## 2012-09-13 VITALS — BP 188/94 | HR 64 | Temp 97.4°F | Wt 129.0 lb

## 2012-09-13 DIAGNOSIS — R911 Solitary pulmonary nodule: Secondary | ICD-10-CM | POA: Diagnosis not present

## 2012-09-13 DIAGNOSIS — F411 Generalized anxiety disorder: Secondary | ICD-10-CM

## 2012-09-13 DIAGNOSIS — R9389 Abnormal findings on diagnostic imaging of other specified body structures: Secondary | ICD-10-CM

## 2012-09-13 DIAGNOSIS — R931 Abnormal findings on diagnostic imaging of heart and coronary circulation: Secondary | ICD-10-CM

## 2012-09-13 DIAGNOSIS — I10 Essential (primary) hypertension: Secondary | ICD-10-CM | POA: Diagnosis not present

## 2012-09-13 DIAGNOSIS — F419 Anxiety disorder, unspecified: Secondary | ICD-10-CM

## 2012-09-13 MED ORDER — LISINOPRIL 10 MG PO TABS: 10.0000 mg | ORAL_TABLET | Freq: Every day | ORAL | Status: DC

## 2012-09-13 MED ORDER — ATENOLOL 50 MG PO TABS: ORAL_TABLET | ORAL | Status: DC

## 2012-09-13 NOTE — Progress Notes (Signed)
Chief Complaint  Patient presents with  . Follow-up    Needs refills of her bp meds.    HPI: Patient comes in today for follow up of  multiple medical problems.  Has been doing ok   Bp pre holidays was in 140 range and on atenolol   Ave 130   At [facility] clinic.  No weaning.   25 daily for a week  Tried to decrease  Feels ok  Still get some has  Had pulmonary nodule ct and cas done recently also as fu.  No other changes in heatlh ROS: See pertinent positives and negatives per HPI.  Past Medical History  Diagnosis Date  . Osteoarthritis   . Osteopenia   . Hypertension   . Allergic rhinitis   . Ocular migraine     Consult - [facility]  hospitalized  . Sarcoidosis   . Retinal tear     OS  . Scoliosis   . Chronic back pain   . Pulmonary nodule     [facility]  . Diverticulosis   . Cataract     Family History  Problem Relation Age of Onset  . Aortic aneurysm    . Stroke    . Colon cancer Neg Hx   . Heart disease Mother   . Lung cancer Father   . Arthritis Father   . Hyperlipidemia Sister   . Arthritis Sister   . Diabetes Brother   . Stroke Brother   . Hyperlipidemia Brother     History   Social History  . Marital Status: Widowed    Spouse Name: N/A    Number of Children: 6  . Years of Education: N/A   Occupational History  . retired    Social History Main Topics  . Smoking status: Never Smoker   . Smokeless tobacco: Never Used  . Alcohol Use: Yes     Comment: Wine, red or white every night  . Drug Use: No  . Sexually Active: None   Other Topics Concern  . None   Social History Narrative   CONSULTSDr Sethi - neuro in ED  Now Dr DohmeierRetired,  Lives  daughters garage  aptWidowed, husband passed away in 11-Dec-2022 2009Vegetarian mostlyDaily caffeine useRegular exercise - YESPlans to travel.     Outpatient Encounter Prescriptions as of 09/13/2012  Medication Sig Dispense Refill  . Cholecalciferol (VITAMIN D PO) Take 1,000 mg by mouth.      .  Magnesium 250 MG TABS Take by mouth 2 (two) times daily.      . Multiple Vitamins-Minerals (MULTIVITAMIN PO) Take by mouth.      . Probiotic Product (PROBIOTIC PO) Take by mouth.      . zolpidem (AMBIEN) 5 MG tablet Take 1 tablet (5 mg total) by mouth at bedtime as needed. When she has a overnight flght.  30 tablet  5  . atenolol (TENORMIN) 25 MG tablet Take 25 mg by mouth 2 (two) times daily.       Marland Kitchen atenolol (TENORMIN) 50 MG tablet Take half tab twice daily as directed.  90 tablet  1  . lisinopril (PRINIVIL,ZESTRIL) 10 MG tablet Take 1 tablet (10 mg total) by mouth daily.  90 tablet  1  . [DISCONTINUED] atenolol (TENORMIN) 50 MG tablet Take half tab twice daily as directed.  90 tablet  0    EXAM:  BP 188/94  Pulse 64  Temp 97.4 F (36.3 C) (Oral)  Wt 129 lb (58.514 kg) Repeat bp  160 /70 There is no height on file to calculate BMI. bp readings repeat GENERAL: vitals reviewed and listed above, alert, oriented, appears well hydrated and in no acute distress  HEENT: atraumatic, conjunctiva  clear, no obvious abnormalities on inspection of external nose and ears OP : no lesion edema or exudate   NECK: no obvious masses on inspection palpation   LUNGS: clear to auscultation bilaterally, no wheezes, rales or rhonchi, good air movement  CV: HRRR, no clubbing cyanosis or  peripheral edema nl cap refill   MS: moves all extremities without noticeable focal  abnormality  PSYCH: pleasant and cooperative, no obvious depression or anxiety  ASSESSMENT AND PLAN:  Discussed the following assessment and plan:  1. Unspecified Essential Hypertension labile componenet    better at home but up today    2. Pulmonary nodule    stable  monitoring no change  3. Anxiety    stress but much improved   4. Agatston coronary artery calcium score less than 100    4.4  lad 25%ile for age  avoiding dc atenolol for now had been on this for bp and also HA suppression and decreasing may be problematic  Will  add low dose acei has nl renal function  Will follow. To continue healthy lifestyle -Patient advised to return or notify health care team  immediately if symptoms worsen or persist or new concerns arise.  Patient Instructions  Stay on the atenolol  For now   25 mg twice a day.  We will add on lisinopril low dose    10 mg per day to see if helps bp without se   Plan ROV in about a month or so   Plan labs at that time  As appropriate .  Fu pulmonary nodules in 12 months or as [facility] advises.     Neta Mends. Khallid Pasillas M.D.

## 2012-09-13 NOTE — Patient Instructions (Addendum)
Stay on the atenolol  For now   25 mg twice a day.  We will add on lisinopril low dose    10 mg per day to see if helps bp without se   Plan ROV in about a month or so   Plan labs at that time  As appropriate .  Fu pulmonary nodules in 12 months or as [facility] advises.

## 2012-09-17 ENCOUNTER — Encounter: Payer: Self-pay | Admitting: Internal Medicine

## 2012-09-17 DIAGNOSIS — R931 Abnormal findings on diagnostic imaging of heart and coronary circulation: Secondary | ICD-10-CM

## 2012-09-17 HISTORY — DX: Abnormal findings on diagnostic imaging of heart and coronary circulation: R93.1

## 2012-09-21 ENCOUNTER — Ambulatory Visit
Admission: RE | Admit: 2012-09-21 | Discharge: 2012-09-21 | Disposition: A | Discharge status: Home / Self Care | Payer: Medicare Other | Source: Ambulatory Visit | Attending: Internal Medicine | Admitting: Internal Medicine

## 2012-09-21 DIAGNOSIS — Z1231 Encounter for screening mammogram for malignant neoplasm of breast: Secondary | ICD-10-CM | POA: Diagnosis not present

## 2012-09-30 ENCOUNTER — Ambulatory Visit: Payer: Medicare Other

## 2012-11-02 DIAGNOSIS — L259 Unspecified contact dermatitis, unspecified cause: Secondary | ICD-10-CM | POA: Diagnosis not present

## 2012-11-15 ENCOUNTER — Ambulatory Visit: Payer: Medicare Other | Admitting: Internal Medicine

## 2013-01-27 ENCOUNTER — Encounter: Payer: Self-pay | Admitting: Internal Medicine

## 2013-01-27 ENCOUNTER — Ambulatory Visit (INDEPENDENT_AMBULATORY_CARE_PROVIDER_SITE_OTHER): Payer: Medicare Other | Admitting: Internal Medicine

## 2013-01-27 VITALS — BP 134/72 | HR 54 | Temp 98.4°F | Wt 129.0 lb

## 2013-01-27 DIAGNOSIS — R5381 Other malaise: Secondary | ICD-10-CM

## 2013-01-27 DIAGNOSIS — R5383 Other fatigue: Secondary | ICD-10-CM | POA: Diagnosis not present

## 2013-01-27 DIAGNOSIS — I10 Essential (primary) hypertension: Secondary | ICD-10-CM

## 2013-01-27 DIAGNOSIS — G43809 Other migraine, not intractable, without status migrainosus: Secondary | ICD-10-CM

## 2013-01-27 DIAGNOSIS — G4700 Insomnia, unspecified: Secondary | ICD-10-CM

## 2013-01-27 MED ORDER — ZOLPIDEM TARTRATE 5 MG PO TABS: 5.0000 mg | ORAL_TABLET | Freq: Every evening | ORAL | Status: DC | PRN

## 2013-01-27 NOTE — Progress Notes (Signed)
Chief Complaint  Patient presents with  . Follow-up    Needs a refill of Ambien.    HPI: Patient comes in today for follow up of  multiple medical problems.  Since last visit no major changes in health but did try the lsiniopril and Got dizzines    And tried for 3 weeks and then stopped  Case of this  But her bp readings have been ok . Reading are  138 and 142 or so and 70-     .     Pulse  55 a few times.  No syncope cp sob   Tired   ? If b12   Would help     Taking tired and traveling.  Visit family.      Takes ambien on [employer]   Never rested in am  awake at night .    Light snoring .  No osa  Obvious  magnesiun jhelped migraines   And these are much better   Probiotic  Has helped gi sx  Last labs  In september  And was  [facility] .  [facility]  ROS: See pertinent positives and negatives per HPI.  Past Medical History  Diagnosis Date  . Osteoarthritis   . Osteopenia   . Hypertension   . Allergic rhinitis   . Ocular migraine     Consult - [facility]  hospitalized  . Sarcoidosis   . Retinal tear     OS  . Scoliosis   . Chronic back pain   . Pulmonary nodule     [facility]  . Diverticulosis   . Cataract   . Agatston coronary artery calcium score less than 100 09/17/2012    4.4  lad 25%ile for age     Family History  Problem Relation Age of Onset  . Aortic aneurysm    . Stroke    . Colon cancer Neg Hx   . Heart disease Mother   . Lung cancer Father   . Arthritis Father   . Hyperlipidemia Sister   . Arthritis Sister   . Diabetes Brother   . Stroke Brother   . Hyperlipidemia Brother     History   Social History  . Marital Status: Widowed    Spouse Name: N/A    Number of Children: 6  . Years of Education: N/A   Occupational History  . retired    Social History Main Topics  . Smoking status: Never Smoker   . Smokeless tobacco: Never Used  . Alcohol Use: Yes     Comment: Wine, red or white every night  . Drug Use: No   . Sexually Active: None   Other Topics Concern  . 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                 Outpatient Encounter Prescriptions as of 01/27/2013  Medication Sig Dispense Refill  . atenolol (TENORMIN) 50 MG tablet Take 50 mg by mouth daily.      . Cholecalciferol (VITAMIN D PO) Take 1,000 mg by mouth.      . Magnesium 250 MG TABS  Take by mouth 2 (two) times daily.      . Multiple Vitamins-Minerals (MULTIVITAMIN PO) Take by mouth.      . Probiotic Product (PROBIOTIC PO) Take by mouth.      . zolpidem (AMBIEN) 5 MG tablet Take 1 tablet (5 mg total) by mouth at bedtime as needed. When she has a overnight flght.  30 tablet  5  . [DISCONTINUED] atenolol (TENORMIN) 50 MG tablet Take half tab twice daily as directed.  90 tablet  1  . zolpidem (AMBIEN) 5 MG tablet Take 1 tablet (5 mg total) by mouth at bedtime as needed for sleep.  15 tablet  0  . [DISCONTINUED] atenolol (TENORMIN) 25 MG tablet Take 25 mg by mouth 2 (two) times daily.       . [DISCONTINUED] lisinopril (PRINIVIL,ZESTRIL) 10 MG tablet Take 1 tablet (10 mg total) by mouth daily.  90 tablet  1   No facility-administered encounter medications on file as of 01/27/2013.    EXAM:  BP 134/72  Pulse 54  Temp(Src) 98.4 F (36.9 C) (Oral)  Wt 129 lb (58.514 kg)  BMI 25.19 kg/m2  SpO2 98%  Body mass index is 25.19 kg/(m^2).  GENERAL: vitals reviewed and listed above, alert, oriented, appears well hydrated and in no acute distress  HEENT: atraumatic, conjunctiva  clear, no obvious abnormalities on inspection of external nose and ears OP : no lesion edema or exudate   NECK: no obvious masses on inspection palpation   LUNGS: clear to auscultation bilaterally, no wheezes, rales or rhonchi,  good air movement  CV: HRRR, no clubbing cyanosis or  peripheral edema nl cap refill   MS: moves all extremities without noticeable focal  abnormality  PSYCH: pleasant and cooperative, no obvious depression or anxiety Lab Results  Component Value Date   WBC 6.9 07/21/2011   HGB 13.4 07/21/2011   HCT 39.1 07/21/2011   PLT 234.0 07/21/2011   GLUCOSE 87 07/21/2011   CHOL 198 07/21/2011   TRIG 60.0 07/21/2011   HDL 76.80 07/21/2011   LDLDIRECT 120.7 05/02/2010   LDLCALC 109* 07/21/2011   ALT 20 07/21/2011   AST 25 07/21/2011   NA 137 07/21/2011   K 4.0 07/21/2011   CL 101 07/21/2011   CREATININE 0.7 07/21/2011   BUN 14 07/21/2011   CO2 25 07/21/2011   TSH 1.71 07/21/2011   INR 1.0 07/23/2008   HGBA1C  Value: 5.7 (NOTE)   The ADA recommends the following therapeutic goal for glycemic   control related to Hgb A1C measurement:   Goal of Therapy:   < 7.0% Hgb A1C   Reference: American Diabetes Association: Clinical Practice   Recommendations 2008, Diabetes Care,  2008, 31:(Suppl 1). 07/23/2008  last labs done at [facility] with quite extensive evaluation   ASSESSMENT AND PLAN:  Discussed the following assessment and plan:  Unspecified essential hypertension - controlled seemingly had se of adding acei  observe for sig brady consdier other  options  INSOMNIA - with flying  travel risk benefit of med disc  MIGRAINE, OPHTHALMIC - stable on magnesium   Tired - vague  not new  poss sleep other  has had full eval at [facility] in the past . can add b12 supp temporarily but doubt disease if no PA.  observe sleep hygiene et.  -Patient advised to return or notify health care team  if symptoms worsen or persist or new concerns arise.  Patient Instructions  Check bp readings   . Tired  ?  ineffective sleep  .   Can take b12 supplement with the vit d   Make sure that your pulse is  Above 55 on a regular basis.   Care  With  ambien  . Can cause mental fogginess  And decrease reaction time.    ROV in 6 months or as needed  Labs yearly   .         Neta Mends. Shantai Tiedeman M.D.

## 2013-01-27 NOTE — Patient Instructions (Addendum)
Check bp readings   . Tired  ? ineffective sleep  .   Can take b12 supplement with the vit d   Make sure that your pulse is  Above 55 on a regular basis.   Care  With  ambien  . Can cause mental fogginess  And decrease reaction time.   ROV in 6 months or as needed  Labs yearly   .

## 2013-01-29 DIAGNOSIS — R5383 Other fatigue: Secondary | ICD-10-CM | POA: Insufficient documentation

## 2013-02-20 ENCOUNTER — Telehealth: Payer: Self-pay | Admitting: Internal Medicine

## 2013-02-20 MED ORDER — ATENOLOL 50 MG PO TABS: 50.0000 mg | ORAL_TABLET | Freq: Every day | ORAL | Status: DC

## 2013-02-20 NOTE — Telephone Encounter (Signed)
Sent by e-scribe. 

## 2013-02-20 NOTE — Telephone Encounter (Signed)
PT called to request a 90 day supply of atenolol (TENORMIN) 50 MG tablet, she would like this sent to [facility] on [address]. Please assist.

## 2013-03-07 DIAGNOSIS — M545 Low back pain, unspecified: Secondary | ICD-10-CM | POA: Diagnosis not present

## 2013-03-07 DIAGNOSIS — M5137 Other intervertebral disc degeneration, lumbosacral region: Secondary | ICD-10-CM | POA: Diagnosis not present

## 2013-03-07 DIAGNOSIS — M48061 Spinal stenosis, lumbar region without neurogenic claudication: Secondary | ICD-10-CM | POA: Diagnosis not present

## 2013-03-07 DIAGNOSIS — M4310 Spondylolisthesis, site unspecified: Secondary | ICD-10-CM | POA: Diagnosis not present

## 2013-03-08 DIAGNOSIS — M545 Low back pain, unspecified: Secondary | ICD-10-CM | POA: Diagnosis not present

## 2013-04-19 DIAGNOSIS — D235 Other benign neoplasm of skin of trunk: Secondary | ICD-10-CM | POA: Diagnosis not present

## 2013-04-19 DIAGNOSIS — L819 Disorder of pigmentation, unspecified: Secondary | ICD-10-CM | POA: Diagnosis not present

## 2013-04-19 DIAGNOSIS — L219 Seborrheic dermatitis, unspecified: Secondary | ICD-10-CM | POA: Diagnosis not present

## 2013-04-20 DIAGNOSIS — Z961 Presence of intraocular lens: Secondary | ICD-10-CM | POA: Diagnosis not present

## 2013-05-05 ENCOUNTER — Ambulatory Visit: Payer: Medicare Other | Admitting: Internal Medicine

## 2013-05-30 DIAGNOSIS — I700 Atherosclerosis of aorta: Secondary | ICD-10-CM | POA: Diagnosis not present

## 2013-05-30 DIAGNOSIS — M4120 Other idiopathic scoliosis, site unspecified: Secondary | ICD-10-CM | POA: Diagnosis not present

## 2013-05-30 DIAGNOSIS — J8410 Pulmonary fibrosis, unspecified: Secondary | ICD-10-CM | POA: Diagnosis not present

## 2013-05-30 DIAGNOSIS — R918 Other nonspecific abnormal finding of lung field: Secondary | ICD-10-CM | POA: Diagnosis not present

## 2013-05-31 DIAGNOSIS — Z131 Encounter for screening for diabetes mellitus: Secondary | ICD-10-CM | POA: Diagnosis not present

## 2013-05-31 DIAGNOSIS — M545 Low back pain, unspecified: Secondary | ICD-10-CM | POA: Diagnosis not present

## 2013-05-31 DIAGNOSIS — J984 Other disorders of lung: Secondary | ICD-10-CM | POA: Diagnosis not present

## 2013-05-31 DIAGNOSIS — Z0000 Encounter for general adult medical examination without abnormal findings: Secondary | ICD-10-CM | POA: Diagnosis not present

## 2013-05-31 DIAGNOSIS — I10 Essential (primary) hypertension: Secondary | ICD-10-CM | POA: Diagnosis not present

## 2013-05-31 DIAGNOSIS — J8410 Pulmonary fibrosis, unspecified: Secondary | ICD-10-CM | POA: Diagnosis not present

## 2013-05-31 DIAGNOSIS — Z1329 Encounter for screening for other suspected endocrine disorder: Secondary | ICD-10-CM | POA: Diagnosis not present

## 2013-05-31 DIAGNOSIS — R5381 Other malaise: Secondary | ICD-10-CM | POA: Diagnosis not present

## 2013-05-31 DIAGNOSIS — Z136 Encounter for screening for cardiovascular disorders: Secondary | ICD-10-CM | POA: Diagnosis not present

## 2013-06-01 DIAGNOSIS — R0609 Other forms of dyspnea: Secondary | ICD-10-CM | POA: Diagnosis not present

## 2013-06-01 DIAGNOSIS — G479 Sleep disorder, unspecified: Secondary | ICD-10-CM | POA: Diagnosis not present

## 2013-06-01 DIAGNOSIS — H6120 Impacted cerumen, unspecified ear: Secondary | ICD-10-CM | POA: Diagnosis not present

## 2013-06-01 DIAGNOSIS — R5381 Other malaise: Secondary | ICD-10-CM | POA: Diagnosis not present

## 2013-09-25 ENCOUNTER — Other Ambulatory Visit: Payer: Self-pay

## 2013-09-25 DIAGNOSIS — Z1231 Encounter for screening mammogram for malignant neoplasm of breast: Secondary | ICD-10-CM

## 2013-09-28 ENCOUNTER — Ambulatory Visit
Admission: RE | Admit: 2013-09-28 | Discharge: 2013-09-28 | Disposition: A | Discharge status: Home / Self Care | Payer: PRIVATE HEALTH INSURANCE | Source: Ambulatory Visit

## 2013-09-28 DIAGNOSIS — Z1231 Encounter for screening mammogram for malignant neoplasm of breast: Secondary | ICD-10-CM | POA: Diagnosis not present

## 2013-10-09 ENCOUNTER — Telehealth: Payer: Self-pay | Admitting: Internal Medicine

## 2013-10-09 MED ORDER — ATENOLOL 50 MG PO TABS: 50.0000 mg | ORAL_TABLET | Freq: Every day | ORAL | Status: DC

## 2013-10-09 NOTE — Telephone Encounter (Signed)
Pt is requesting new rx atenolol, sent to [facility] on [address], pt states she is leaving going out of town on 10/11/13 and not sure how long she will be gone and she will run out of meds.

## 2013-10-09 NOTE — Telephone Encounter (Signed)
Sent to the pharmacy by e-scribe. 

## 2013-10-09 NOTE — Telephone Encounter (Signed)
Patient called to speak with a nurse about her b/p but asked to be contacted after 1pm.  I just attempted to contact her and left a vm.

## 2013-10-10 NOTE — Telephone Encounter (Signed)
Spoke to the pt.  She needed her BP medication.  Did not have a question about her BP.  Pt notified that rx was sent to the pharmacy on 10/09/13.

## 2013-10-11 ENCOUNTER — Encounter: Payer: Self-pay | Admitting: Family Medicine

## 2013-10-11 ENCOUNTER — Ambulatory Visit (INDEPENDENT_AMBULATORY_CARE_PROVIDER_SITE_OTHER): Payer: Medicare Other | Admitting: Family Medicine

## 2013-10-11 VITALS — BP 170/76 | HR 59 | Temp 99.2°F | Ht 60.0 in | Wt 127.0 lb

## 2013-10-11 DIAGNOSIS — I10 Essential (primary) hypertension: Secondary | ICD-10-CM | POA: Diagnosis not present

## 2013-10-11 DIAGNOSIS — F419 Anxiety disorder, unspecified: Secondary | ICD-10-CM

## 2013-10-11 DIAGNOSIS — F411 Generalized anxiety disorder: Secondary | ICD-10-CM

## 2013-10-11 MED ORDER — HYDROCHLOROTHIAZIDE 25 MG PO TABS: 25.0000 mg | ORAL_TABLET | Freq: Every day | ORAL | Status: DC

## 2013-10-11 MED ORDER — FLUOXETINE HCL 10 MG PO CAPS: 10.0000 mg | ORAL_CAPSULE | Freq: Every day | ORAL | Status: DC

## 2013-10-11 NOTE — Progress Notes (Signed)
   Subjective:    Patient ID: Kristin Jimenez, female    DOB: 1934/07/11, 80 y.o.   MRN: [id number]  HPI Here for high BP readings and for anxiety. She has been on Atenolol for several years and had done well. Then over the past few months her systolic readings have been going up to the 150s at home. Her pulse rate remains low in the 50s or 60s. She still works out at [employer] and does yoga. She has also felt more anxiety thatn ever before the past few months. She get shaky and nervous, she worries about things, and she can't relax. Early this morning she was preparing to catch a flight to [place] to visit her daughter when she suddenly felt an overwhelming sense of dread and felt like she might die. No SOB or chest pain. Her BP at that point was 200 over 100. She sat down and cancelled her flight, and she tried to relax. She went to her daughter's house, and stayed there until now. At the moment she feels much better. She has had some anxiety issues in the past, especially after the death of her husband. She used Lorazepam for a short time at that period of her life. She denies any depression sx.    Review of Systems  Constitutional: Negative.   Respiratory: Negative.   Cardiovascular: Negative.   Neurological: Negative.   Psychiatric/Behavioral: Positive for agitation. Negative for hallucinations, behavioral problems, confusion, dysphoric mood and decreased concentration. The patient is nervous/anxious.        Objective:   Physical Exam  Constitutional: She is oriented to person, place, and time. She appears well-developed and well-nourished. No distress.  Neck: No thyromegaly present.  Cardiovascular: Normal rate, regular rhythm, normal heart sounds and intact distal pulses.   Pulmonary/Chest: Effort normal and breath sounds normal.  Lymphadenopathy:    She has no cervical adenopathy.  Neurological: She is alert and oriented to person, place, and time.  Psychiatric: She has a normal  mood and affect. Her behavior is normal. Thought content normal.          Assessment & Plan:  Her BP has slowly increased in the past few months, so we will add HCTZ to her Atenolol. She has had a lot of anxiety and I think this has played a lage role in why her BP has been up. We will start on Prozac 10 mg daily. Recheck in 2 weeks

## 2013-10-13 ENCOUNTER — Telehealth: Payer: Self-pay | Admitting: Internal Medicine

## 2013-10-13 NOTE — Telephone Encounter (Signed)
Relevant patient education mailed to patient.  

## 2013-11-03 ENCOUNTER — Telehealth: Payer: Self-pay | Admitting: Internal Medicine

## 2013-11-03 NOTE — Telephone Encounter (Signed)
I spoke with pt and she is going to schedule the office visit.

## 2013-11-03 NOTE — Telephone Encounter (Signed)
I had intended for her to have a follow up visit with Dr. Regis Bill 2 weeks after I saw her. She needs to have her BP checked and then she can discuss the anxiety. Make an OV with Dr. Regis Bill for next week

## 2013-11-03 NOTE — Telephone Encounter (Signed)
Pt was prescribed prozac and med is not working well. Pt saw dr fry. [facility] pt would like to get atenolol-hctz in one pill instead of two separate pills.

## 2013-11-08 ENCOUNTER — Ambulatory Visit (INDEPENDENT_AMBULATORY_CARE_PROVIDER_SITE_OTHER): Payer: Medicare Other | Admitting: Internal Medicine

## 2013-11-08 ENCOUNTER — Telehealth: Payer: Self-pay | Admitting: Internal Medicine

## 2013-11-08 ENCOUNTER — Encounter: Payer: Self-pay | Admitting: Internal Medicine

## 2013-11-08 VITALS — BP 144/80 | Temp 99.1°F | Ht 59.5 in | Wt 127.0 lb

## 2013-11-08 DIAGNOSIS — T887XXA Unspecified adverse effect of drug or medicament, initial encounter: Secondary | ICD-10-CM | POA: Diagnosis not present

## 2013-11-08 DIAGNOSIS — F419 Anxiety disorder, unspecified: Secondary | ICD-10-CM

## 2013-11-08 DIAGNOSIS — F411 Generalized anxiety disorder: Secondary | ICD-10-CM | POA: Diagnosis not present

## 2013-11-08 DIAGNOSIS — M1990 Unspecified osteoarthritis, unspecified site: Secondary | ICD-10-CM

## 2013-11-08 DIAGNOSIS — I10 Essential (primary) hypertension: Secondary | ICD-10-CM

## 2013-11-08 LAB — BASIC METABOLIC PANEL
BUN: 10 mg/dL (ref 6–23)
CHLORIDE: 91 meq/L — AB (ref 96–112)
CO2: 26 meq/L (ref 19–32)
CREATININE: 0.7 mg/dL (ref 0.4–1.2)
Calcium: 9.8 mg/dL (ref 8.4–10.5)
GFR: 82.08 mL/min (ref >60.00)
Glucose, Bld: 89 mg/dL (ref 70–99)
POTASSIUM: 5 meq/L (ref 3.5–5.1)
Sodium: 126 mEq/L — ABNORMAL LOW (ref 135–145)

## 2013-11-08 MED ORDER — HYDROCHLOROTHIAZIDE 25 MG PO TABS: 25.0000 mg | ORAL_TABLET | Freq: Every day | ORAL | Status: DC

## 2013-11-08 MED ORDER — CITALOPRAM HYDROBROMIDE 10 MG PO TABS: 10.0000 mg | ORAL_TABLET | Freq: Every day | ORAL | Status: DC

## 2013-11-08 NOTE — Progress Notes (Signed)
Chief Complaint  Patient presents with  . Follow-up    Meds.  Complains of being jittery when taking Prozac.    HPI: Last seen by me may 2014  Saw dr Sarajane Jews last month for anxiety and began on prozac  And hctz added for labile ht.  Since that time she's noticed that the fluoxetine causes some jitteriness and makes her not feel so well doesn't think it really helps her anxiety. She hasn't had a full-blown panic attack but has difficulty driving on her own. Her going to new places. Is okay when other people are with her. BP 120/80 this morning no side effects of the HCTZ. Is almost out. Seems to go up when she's anxious and stressed. Back pain is a lot better she was doing stem cell therapy injections for her back from [facility] and states it has been very helpful for the arthritis pain she still has discomfort but it tolerated She thinks her mother has anxiety but uncertain what medicines would've been helpful. ROS: See pertinent positives and negatives per HPI. No current chest pain shortness of breath or syncope.  Past Medical History  Diagnosis Date  . Osteoarthritis   . Osteopenia   . Hypertension   . Allergic rhinitis   . Ocular migraine     Consult - [facility]  hospitalized  . Sarcoidosis   . Retinal tear     OS  . Scoliosis   . Chronic back pain   . Pulmonary nodule     [facility]  . Diverticulosis   . Cataract   . Agatston coronary artery calcium score less than 100 09/17/2012    4.4  lad 25%ile for age     Family History  Problem Relation Age of Onset  . Aortic aneurysm    . Stroke    . Colon cancer Neg Hx   . Heart disease Mother   . Lung cancer Father   . Arthritis Father   . Hyperlipidemia Sister   . Arthritis Sister   . Diabetes Brother   . Stroke Brother   . Hyperlipidemia Brother     History   Social History  . Marital Status: Widowed    Spouse Name: N/A    Number of Children: 6  . Years of Education: N/A   Occupational History   . retired    Social History Main Topics  . Smoking status: Never Smoker   . Smokeless tobacco: Never Used  . Alcohol Use: Yes     Comment: Wine, red or white every night  . Drug Use: No  . Sexual Activity: None   Other Topics Concern  . None   Social History Narrative   CONSULTS   Dr Leonie Man - neuro in ED  Now Dr Brett Fairy      Retired,  Lives  daughters garage  apt   Widowed, husband passed away in 01-Dec-2007  Vegetarian mostly   Daily caffeine use   Regular exercise - YES   Plans to travel.                 Outpatient Encounter Prescriptions as of 11/30/2013  Medication Sig  . atenolol (TENORMIN) 50 MG tablet Take 1 tablet (50 mg total) by mouth daily.  . Cholecalciferol (VITAMIN D PO) Take 1,000 mg by mouth.  . hydrochlorothiazide (HYDRODIURIL) 25 MG tablet Take 1 tablet (25 mg total) by mouth daily.  . Magnesium 250 MG TABS Take by mouth at bedtime.   Marland Kitchen  Multiple Vitamins-Minerals (MULTIVITAMIN PO) Take by mouth.  . Probiotic Product (PROBIOTIC PO) Take by mouth.  . zolpidem (AMBIEN) 5 MG tablet Take 1 tablet (5 mg total) by mouth at bedtime as needed for sleep.  . [DISCONTINUED] FLUoxetine (PROZAC) 10 MG capsule Take 1 capsule (10 mg total) by mouth daily.  . [DISCONTINUED] hydrochlorothiazide (HYDRODIURIL) 25 MG tablet Take 1 tablet (25 mg total) by mouth daily.  . citalopram (CELEXA) 10 MG tablet Take 1 tablet (10 mg total) by mouth daily.  . [DISCONTINUED] zolpidem (AMBIEN) 5 MG tablet Take 1 tablet (5 mg total) by mouth at bedtime as needed. When she has a overnight flght.    EXAM:  BP 144/80  Temp(Src) 99.1 F (37.3 C) (Oral)  Ht 4' 11.5" (1.511 m)  Wt 127 lb (57.607 kg)  BMI 25.23 kg/m2  Body mass index is 25.23 kg/(m^2).  GENERAL: vitals reviewed and listed above, alert, oriented, appears well hydrated and in no acute distress  HEENT: atraumatic, conjunctiva  clear, no obvious abnormalities on inspection of external nose and ears NECK: no obvious  masses on inspection palpation LUNGS: clear to auscultation bilaterally, no wheezes, rales or rhonchi, good air movement CV: HRRR, no clubbing cyanosis or  peripheral edema nl cap refill  MS: moves all extremities without noticeable focal  Abnormality oa changes  PSYCH: pleasant and cooperative, mildly anxious good eye contact   ASSESSMENT AND PLAN:  Discussed the following assessment and plan:  Unspecified essential hypertension - continue check bmp today - Plan: Basic metabolic panel  Anxiety - situational consider CBT strategies change med low dose celexa  - Plan: Basic metabolic panel  Medication side effect - jitteriness with prozac 10  -Patient advised to return or notify health care team  if symptoms worsen or persist or new concerns arise.  Patient Instructions  Changed to fluoxetine to  catch her having side effects. Sometimes adding an ocassional anxiety pill can help but they can be habit forming  And wouldn't be a long term,solution Continue The diuretic and beta blocker. We should check a potassium level to make sure that is okay. Consider seeing a counselor who can do cognitive therapy in regard to anxiety panic strategies. Visualization etc. Followup visit in about a month or as needed.    Standley Brooking. Panosh M.D.  Pre visit review using our clinic review tool, if applicable. No additional management support is needed unless otherwise documented below in the visit note.

## 2013-11-08 NOTE — Telephone Encounter (Signed)
Relevant patient education mailed to patient.  

## 2013-11-08 NOTE — Patient Instructions (Addendum)
Changed to fluoxetine to  catch her having side effects. Sometimes adding an ocassional anxiety pill can help but they can be habit forming  And wouldn't be a long term,solution Continue The diuretic and beta blocker. We should check a potassium level to make sure that is okay. Consider seeing a counselor who can do cognitive therapy in regard to anxiety panic strategies. Visualization etc. Followup visit in about a month or as needed.

## 2013-11-08 NOTE — Assessment & Plan Note (Signed)
Tries to stay active exercise

## 2013-11-10 ENCOUNTER — Other Ambulatory Visit: Payer: Self-pay | Admitting: Family Medicine

## 2013-11-10 DIAGNOSIS — E871 Hypo-osmolality and hyponatremia: Secondary | ICD-10-CM

## 2013-11-15 DIAGNOSIS — Z0000 Encounter for general adult medical examination without abnormal findings: Secondary | ICD-10-CM | POA: Diagnosis not present

## 2013-11-22 ENCOUNTER — Encounter: Payer: Self-pay | Admitting: *Deleted

## 2013-11-23 ENCOUNTER — Other Ambulatory Visit (INDEPENDENT_AMBULATORY_CARE_PROVIDER_SITE_OTHER): Payer: Medicare Other

## 2013-11-23 DIAGNOSIS — E871 Hypo-osmolality and hyponatremia: Secondary | ICD-10-CM

## 2013-11-23 LAB — BASIC METABOLIC PANEL
BUN: 13 mg/dL (ref 6–23)
CHLORIDE: 90 meq/L — AB (ref 96–112)
CO2: 25 meq/L (ref 19–32)
Calcium: 9.3 mg/dL (ref 8.4–10.5)
Creatinine, Ser: 0.7 mg/dL (ref 0.4–1.2)
GFR: 86.15 mL/min (ref >60.00)
GLUCOSE: 132 mg/dL — AB (ref 70–99)
POTASSIUM: 3.4 meq/L — AB (ref 3.5–5.1)
Sodium: 127 mEq/L — ABNORMAL LOW (ref 135–145)

## 2013-12-01 ENCOUNTER — Other Ambulatory Visit: Payer: Self-pay | Admitting: Family Medicine

## 2013-12-21 ENCOUNTER — Other Ambulatory Visit (INDEPENDENT_AMBULATORY_CARE_PROVIDER_SITE_OTHER): Payer: Medicare Other

## 2013-12-21 DIAGNOSIS — E871 Hypo-osmolality and hyponatremia: Secondary | ICD-10-CM

## 2013-12-21 LAB — BASIC METABOLIC PANEL
BUN: 13 mg/dL (ref 6–23)
CALCIUM: 9 mg/dL (ref 8.4–10.5)
CO2: 26 meq/L (ref 19–32)
CREATININE: 0.7 mg/dL (ref 0.4–1.2)
Chloride: 104 mEq/L (ref 96–112)
GFR: 84.73 mL/min (ref >60.00)
GLUCOSE: 98 mg/dL (ref 70–99)
Potassium: 4.1 mEq/L (ref 3.5–5.1)
Sodium: 138 mEq/L (ref 135–145)

## 2013-12-25 ENCOUNTER — Encounter: Payer: Self-pay | Admitting: Family Medicine

## 2013-12-25 DIAGNOSIS — M5137 Other intervertebral disc degeneration, lumbosacral region: Secondary | ICD-10-CM | POA: Diagnosis not present

## 2013-12-25 DIAGNOSIS — M62838 Other muscle spasm: Secondary | ICD-10-CM | POA: Diagnosis not present

## 2013-12-25 DIAGNOSIS — M9981 Other biomechanical lesions of cervical region: Secondary | ICD-10-CM | POA: Diagnosis not present

## 2013-12-25 DIAGNOSIS — M999 Biomechanical lesion, unspecified: Secondary | ICD-10-CM | POA: Diagnosis not present

## 2013-12-27 DIAGNOSIS — M62838 Other muscle spasm: Secondary | ICD-10-CM | POA: Diagnosis not present

## 2013-12-27 DIAGNOSIS — M9981 Other biomechanical lesions of cervical region: Secondary | ICD-10-CM | POA: Diagnosis not present

## 2013-12-27 DIAGNOSIS — M5137 Other intervertebral disc degeneration, lumbosacral region: Secondary | ICD-10-CM | POA: Diagnosis not present

## 2013-12-27 DIAGNOSIS — M999 Biomechanical lesion, unspecified: Secondary | ICD-10-CM | POA: Diagnosis not present

## 2013-12-28 DIAGNOSIS — M999 Biomechanical lesion, unspecified: Secondary | ICD-10-CM | POA: Diagnosis not present

## 2013-12-28 DIAGNOSIS — M9981 Other biomechanical lesions of cervical region: Secondary | ICD-10-CM | POA: Diagnosis not present

## 2013-12-28 DIAGNOSIS — M62838 Other muscle spasm: Secondary | ICD-10-CM | POA: Diagnosis not present

## 2013-12-28 DIAGNOSIS — M5137 Other intervertebral disc degeneration, lumbosacral region: Secondary | ICD-10-CM | POA: Diagnosis not present

## 2013-12-29 DIAGNOSIS — Z961 Presence of intraocular lens: Secondary | ICD-10-CM | POA: Diagnosis not present

## 2014-01-01 DIAGNOSIS — M9981 Other biomechanical lesions of cervical region: Secondary | ICD-10-CM | POA: Diagnosis not present

## 2014-01-01 DIAGNOSIS — M62838 Other muscle spasm: Secondary | ICD-10-CM | POA: Diagnosis not present

## 2014-01-01 DIAGNOSIS — M5137 Other intervertebral disc degeneration, lumbosacral region: Secondary | ICD-10-CM | POA: Diagnosis not present

## 2014-01-01 DIAGNOSIS — M999 Biomechanical lesion, unspecified: Secondary | ICD-10-CM | POA: Diagnosis not present

## 2014-01-03 DIAGNOSIS — M62838 Other muscle spasm: Secondary | ICD-10-CM | POA: Diagnosis not present

## 2014-01-03 DIAGNOSIS — M5137 Other intervertebral disc degeneration, lumbosacral region: Secondary | ICD-10-CM | POA: Diagnosis not present

## 2014-01-03 DIAGNOSIS — M9981 Other biomechanical lesions of cervical region: Secondary | ICD-10-CM | POA: Diagnosis not present

## 2014-01-03 DIAGNOSIS — M999 Biomechanical lesion, unspecified: Secondary | ICD-10-CM | POA: Diagnosis not present

## 2014-01-04 DIAGNOSIS — M5137 Other intervertebral disc degeneration, lumbosacral region: Secondary | ICD-10-CM | POA: Diagnosis not present

## 2014-01-04 DIAGNOSIS — M62838 Other muscle spasm: Secondary | ICD-10-CM | POA: Diagnosis not present

## 2014-01-04 DIAGNOSIS — M999 Biomechanical lesion, unspecified: Secondary | ICD-10-CM | POA: Diagnosis not present

## 2014-01-04 DIAGNOSIS — M9981 Other biomechanical lesions of cervical region: Secondary | ICD-10-CM | POA: Diagnosis not present

## 2014-01-05 ENCOUNTER — Ambulatory Visit (INDEPENDENT_AMBULATORY_CARE_PROVIDER_SITE_OTHER): Payer: Medicare Other | Admitting: Internal Medicine

## 2014-01-05 ENCOUNTER — Encounter: Payer: Self-pay | Admitting: Internal Medicine

## 2014-01-05 VITALS — BP 158/78 | Temp 98.6°F | Ht 59.5 in | Wt 128.0 lb

## 2014-01-05 DIAGNOSIS — I10 Essential (primary) hypertension: Secondary | ICD-10-CM | POA: Diagnosis not present

## 2014-01-05 DIAGNOSIS — R42 Dizziness and giddiness: Secondary | ICD-10-CM

## 2014-01-05 DIAGNOSIS — E871 Hypo-osmolality and hyponatremia: Secondary | ICD-10-CM

## 2014-01-05 LAB — BASIC METABOLIC PANEL
BUN: 12 mg/dL (ref 6–23)
CO2: 26 mEq/L (ref 19–32)
CREATININE: 0.6 mg/dL (ref 0.4–1.2)
Calcium: 9.3 mg/dL (ref 8.4–10.5)
Chloride: 99 mEq/L (ref 96–112)
GFR: 99.07 mL/min (ref >60.00)
Glucose, Bld: 96 mg/dL (ref 70–99)
Potassium: 3.9 mEq/L (ref 3.5–5.1)
Sodium: 133 mEq/L — ABNORMAL LOW (ref 135–145)

## 2014-01-05 NOTE — Patient Instructions (Signed)
Make sure blood pressure is good at home . Avoid head maneurverus with the chiropractor as this coudl possible aggravate and inner ear cause of vertigo. You exam is good today. Will notify you  of labs when available. To be sure that the sodium level is still normal .

## 2014-01-05 NOTE — Progress Notes (Signed)
Chief Complaint  Patient presents with  . Follow-up    Started yesterday  . Dizziness    HPI: Fu ht meds anxiety   BP has been good 132/70 at home   Today  Carmin Muskrat to chiropractor for back adjuster . Laying down and then standing up and felt light  headed. Spinning  And  ? If  A problem.    3 x per week.  Short .  Poss spinny  Feeling.  No falling a lot better today but still a bit off  Seems to have had her head turned and moved around .  Behind eyes .   He is a Pension scheme manager. From church   No supplements  Now.  Changed the diet  To avoid sugar .  Had low sodium on  Diuretic med but did well for the bp Anxiety better at this time thinks the chiro helps that.  As well as back  ROS: See pertinent positives and negatives per HPI. No cps ob falling numbness weakness  Past Medical History  Diagnosis Date  . Osteoarthritis   . Osteopenia   . Hypertension   . Allergic rhinitis   . Ocular migraine     Consult - [facility]  hospitalized  . Sarcoidosis   . Retinal tear     OS  . Scoliosis   . Chronic back pain   . Pulmonary nodule     [facility]  . Diverticulosis   . Cataract   . Agatston coronary artery calcium score less than 100 09/17/2012    4.4  lad 25%ile for age     Family History  Problem Relation Age of Onset  . Aortic aneurysm    . Stroke    . Colon cancer Neg Hx   . Heart disease Mother   . Lung cancer Father   . Arthritis Father   . Hyperlipidemia Sister   . Arthritis Sister   . Diabetes Brother   . Stroke Brother   . Hyperlipidemia Brother     History   Social History  . Marital Status: Widowed    Spouse Name: N/A    Number of Children: 6  . Years of Education: N/A   Occupational History  . retired    Social History Main Topics  . Smoking status: Never Smoker   . Smokeless tobacco: Never Used  . Alcohol Use: Yes     Comment: Wine, red or white every night  . Drug Use: No  . Sexual Activity: None   Other Topics  Concern  . None   Social History Narrative   CONSULTS   Dr Leonie Man - neuro in ED  Now Dr Brett Fairy      Retired,  Lives  daughters garage  apt   Widowed, husband passed away in 11/13/2007  Vegetarian mostly   Daily caffeine use   Regular exercise - YES   Plans to travel.                 Outpatient Encounter Prescriptions as of 01/05/2014  Medication Sig  . atenolol (TENORMIN) 50 MG tablet Take 1 tablet (50 mg total) by mouth daily.  . Cholecalciferol (VITAMIN D PO) Take 1,000 mg by mouth.  . Magnesium 250 MG TABS Take by mouth at bedtime.   . Multiple Vitamins-Minerals (MULTIVITAMIN PO) Take by mouth.  . Probiotic Product (PROBIOTIC PO) Take by mouth.  . zolpidem (AMBIEN) 5 MG tablet Take 5 mg  by mouth at bedtime as needed for sleep. For Travel  . [DISCONTINUED] zolpidem (AMBIEN) 5 MG tablet Take 1 tablet (5 mg total) by mouth at bedtime as needed for sleep.  . [DISCONTINUED] citalopram (CELEXA) 10 MG tablet Take 1 tablet (10 mg total) by mouth daily.    EXAM:  BP 158/78  Temp(Src) 98.6 F (37 C) (Oral)  Ht 4' 11.5" (1.511 m)  Wt 128 lb (58.06 kg)  BMI 25.43 kg/m2  Body mass index is 25.43 kg/(m^2).  GENERAL: vitals reviewed and listed above, alert, oriented, appears well hydrated and in no acute distress HEENT: atraumatic, conjunctiva  clear, no obvious abnormalities on inspection of external nose and ears OP : no lesion edema or exudate  NECK: no obvious masses on inspection palpation no bruit LUNGS: clear to auscultation bilaterally, no wheezes, rales or rhonchi, good air movement CV: HRRR, no clubbing cyanosis or  peripheral edema nl cap refill  MS: moves all extremities without noticeable focal  abnormality PSYCH: pleasant and cooperative, no obvious depression or anxiety NEURO: oriented x 3 CN 3-12 appear intact. No focal muscle weakness or atrophy. DTRs symmetrical. Gait WNL.  Grossly non focal. No tremor or abnormal movement.neg rhomberg   ASSESSMENT AND  PLAN:  Discussed the following assessment and plan:  Dizziness - seems more like vertigo after chiro rx reassuring exam today poss vestibular cause folow r/o hyponatremia although off meds  - Plan: Basic metabolic panel  Low sodium levels - on diuretic and ? ssri - Plan: Basic metabolic panel  Unspecified essential hypertension - states in control at home  uptoday as in past  - Plan: Basic metabolic panel i suspect  relationship to the  chiro visit poss from motion and head manipulation bu no evident of neuro issue and sounds more tlike  Poss vestibular   Anxiety reported as goo at this time. Avoid neck manipulations and care  With position changes  -Patient advised to return or notify health care team  if symptoms worsen ,persist or new concerns arise.  Patient Instructions  Make sure blood pressure is good at home . Avoid head maneurverus with the chiropractor as this coudl possible aggravate and inner ear cause of vertigo. You exam is good today. Will notify you  of labs when available. To be sure that the sodium level is still normal .   Mariann Laster K. Ichelle Harral M.D.  Pre visit review using our clinic review tool, if applicable. No additional management support is needed unless otherwise documented below in the visit note.

## 2014-01-08 DIAGNOSIS — M999 Biomechanical lesion, unspecified: Secondary | ICD-10-CM | POA: Diagnosis not present

## 2014-01-08 DIAGNOSIS — M62838 Other muscle spasm: Secondary | ICD-10-CM | POA: Diagnosis not present

## 2014-01-08 DIAGNOSIS — M5137 Other intervertebral disc degeneration, lumbosacral region: Secondary | ICD-10-CM | POA: Diagnosis not present

## 2014-01-08 DIAGNOSIS — M9981 Other biomechanical lesions of cervical region: Secondary | ICD-10-CM | POA: Diagnosis not present

## 2014-01-09 ENCOUNTER — Ambulatory Visit (INDEPENDENT_AMBULATORY_CARE_PROVIDER_SITE_OTHER): Payer: Medicare Other | Admitting: Family Medicine

## 2014-01-09 ENCOUNTER — Other Ambulatory Visit: Payer: Self-pay | Admitting: Family Medicine

## 2014-01-09 ENCOUNTER — Encounter: Payer: Self-pay | Admitting: Family Medicine

## 2014-01-09 ENCOUNTER — Telehealth: Payer: Self-pay | Admitting: Internal Medicine

## 2014-01-09 VITALS — BP 116/82 | HR 61 | Temp 99.3°F | Ht 59.5 in | Wt 127.0 lb

## 2014-01-09 DIAGNOSIS — J309 Allergic rhinitis, unspecified: Secondary | ICD-10-CM

## 2014-01-09 DIAGNOSIS — H6120 Impacted cerumen, unspecified ear: Secondary | ICD-10-CM | POA: Diagnosis not present

## 2014-01-09 MED ORDER — ATENOLOL 50 MG PO TABS: 50.0000 mg | ORAL_TABLET | Freq: Every day | ORAL | Status: DC

## 2014-01-09 NOTE — Telephone Encounter (Signed)
Patient Information:  Caller Name: [place]  Phone: [phone]  Patient: Kristin Jimenez  Gender: Female  DOB: 1933/10/31  Age: 80 Years  PCP: Shanon Ace (Family Practice)  Office Follow Up:  Does the office need to follow up with this patient?: No  Instructions For The Office: N/A   Symptoms  Reason For Call & Symptoms: Seen in office on Friday 5/1 wtih slight dizzinesxs and Dr Regis Bill said might be problem with inner ear.  By yesterday am 5/4 dizziness gone but woke with right ear feels totally blocked/plugged up and can't hear out of ear at all.  Slight hearing loss in left ear also.  Has 6 hour flight on Saturday 5/9.  Reviewed Health History In EMR: Yes  Reviewed Medications In EMR: Yes  Reviewed Allergies In EMR: Yes  Reviewed Surgeries / Procedures: Yes  Date of Onset of Symptoms: 01/08/2014  Guideline(s) Used:  Hearing Loss  Disposition Per Guideline:   Go to Office Now  Reason For Disposition Reached:   Hearing loss in one or both ears of sudden onset and present now  Advice Given:  N/A  Patient Will Follow Care Advice:  YES  Appointment Scheduled:  01/09/2014 11:00:00 Appointment Scheduled Provider:  Maudie Mercury (TEXT 1st, after 20 mins can call), [facility])

## 2014-01-09 NOTE — Telephone Encounter (Signed)
Noted  

## 2014-01-09 NOTE — Patient Instructions (Signed)
For your allergies you can try:  Afrin for 4 days - then stop  Nasocort daily for 21 days  Claritin or Allegra

## 2014-01-09 NOTE — Progress Notes (Signed)
Pre visit review using our clinic review tool, if applicable. No additional management support is needed unless otherwise documented below in the visit note. 

## 2014-01-09 NOTE — Progress Notes (Signed)
No chief complaint on file.   HPI:  -started: last week - has bad allergies -symptoms:nasal congestion, fullness in ear, ears plugged -denies:fever, SOB, NVD, tooth pain -has tried: nothing -sick contacts/travel/risks: denies flu exposure, tick exposure or or Ebola risks -Hx of: allergies - reports doesn't take anything for this ROS: See pertinent positives and negatives per HPI.  Past Medical History  Diagnosis Date  . Osteoarthritis   . Osteopenia   . Hypertension   . Allergic rhinitis   . Ocular migraine     Consult - [facility]  hospitalized  . Sarcoidosis   . Retinal tear     OS  . Scoliosis   . Chronic back pain   . Pulmonary nodule     [facility]  . Diverticulosis   . Cataract   . Agatston coronary artery calcium score less than 100 09/17/2012    4.4  lad 25%ile for age     Past Surgical History  Procedure Laterality Date  . Abdominal hysterectomy    . Bilateral salpingoophorectomy    . Back surgery      x 2  . Cataract extraction  12/11    Family History  Problem Relation Age of Onset  . Aortic aneurysm    . Stroke    . Colon cancer Neg Hx   . Heart disease Mother   . Lung cancer Father   . Arthritis Father   . Hyperlipidemia Sister   . Arthritis Sister   . Diabetes Brother   . Stroke Brother   . Hyperlipidemia Brother     History   Social History  . Marital Status: Widowed    Spouse Name: N/A    Number of Children: 6  . Years of Education: N/A   Occupational History  . retired    Social History Main Topics  . Smoking status: Never Smoker   . Smokeless tobacco: Never Used  . Alcohol Use: Yes     Comment: Wine, red or white every night  . Drug Use: No  . Sexual Activity: None   Other Topics Concern  . None   Social History Narrative   CONSULTS   Dr Leonie Man - neuro in ED  Now Dr Brett Fairy      Retired,  Lives  daughters garage  apt   Widowed, husband passed away in 11/27/07  Vegetarian mostly   Daily caffeine use    Regular exercise - YES   Plans to travel.                 Current outpatient prescriptions:atenolol (TENORMIN) 50 MG tablet, Take 1 tablet (50 mg total) by mouth daily., Disp: 90 tablet, Rfl: 0;  Cholecalciferol (VITAMIN D PO), Take 1,000 mg by mouth., Disp: , Rfl: ;  Magnesium 250 MG TABS, Take by mouth at bedtime. , Disp: , Rfl: ;  Multiple Vitamins-Minerals (MULTIVITAMIN PO), Take by mouth., Disp: , Rfl: ;  Probiotic Product (PROBIOTIC PO), Take by mouth., Disp: , Rfl:  zolpidem (AMBIEN) 5 MG tablet, Take 5 mg by mouth at bedtime as needed for sleep. For Travel, Disp: , Rfl:   EXAM:  Filed Vitals:   01/09/14 1056  BP: 116/82  Pulse: 61  Temp: 99.3 F (37.4 C)    Body mass index is 25.23 kg/(m^2).  GENERAL: vitals reviewed and listed above, alert, oriented, appears well hydrated and in no acute distress  HEENT: atraumatic, conjunttiva clear, no obvious abnormalities on inspection of external nose and  ears, bilat cerumen impaction, clear nasal congestion, mild post oropharyngeal erythema with PND, no tonsillar edema or exudate, no sinus TTP  NECK: no obvious masses on inspection  LUNGS: clear to auscultation bilaterally, no wheezes, rales or rhonchi, good air movement  CV: HRRR, no peripheral edema  MS: moves all extremities without noticeable abnormality  PSYCH: pleasant and cooperative, no obvious depression or anxiety  ASSESSMENT AND PLAN:  Discussed the following assessment and plan:  Cerumen impaction  Allergic rhinitis  -she wanted ear lavage after discussion risks and benefits, tolerated well -advised tx ofher allergies with afrin for 4 days then stop, nasocort and antihistamine -of course, we advised to return or notify a doctor immediately if symptoms worsen or persist or new concerns arise.    Patient Instructions  For your allergies you can try:  Afrin for 4 days - then stop  Nasocort daily for 21 days  Claritin or Allegra     Lucretia Kern

## 2014-01-10 DIAGNOSIS — M5137 Other intervertebral disc degeneration, lumbosacral region: Secondary | ICD-10-CM | POA: Diagnosis not present

## 2014-01-10 DIAGNOSIS — M62838 Other muscle spasm: Secondary | ICD-10-CM | POA: Diagnosis not present

## 2014-01-10 DIAGNOSIS — M9981 Other biomechanical lesions of cervical region: Secondary | ICD-10-CM | POA: Diagnosis not present

## 2014-01-10 DIAGNOSIS — M999 Biomechanical lesion, unspecified: Secondary | ICD-10-CM | POA: Diagnosis not present

## 2014-01-11 DIAGNOSIS — M999 Biomechanical lesion, unspecified: Secondary | ICD-10-CM | POA: Diagnosis not present

## 2014-01-11 DIAGNOSIS — M62838 Other muscle spasm: Secondary | ICD-10-CM | POA: Diagnosis not present

## 2014-01-11 DIAGNOSIS — M5137 Other intervertebral disc degeneration, lumbosacral region: Secondary | ICD-10-CM | POA: Diagnosis not present

## 2014-01-11 DIAGNOSIS — M9981 Other biomechanical lesions of cervical region: Secondary | ICD-10-CM | POA: Diagnosis not present

## 2014-01-12 DIAGNOSIS — M5137 Other intervertebral disc degeneration, lumbosacral region: Secondary | ICD-10-CM | POA: Diagnosis not present

## 2014-01-12 DIAGNOSIS — M9981 Other biomechanical lesions of cervical region: Secondary | ICD-10-CM | POA: Diagnosis not present

## 2014-01-12 DIAGNOSIS — M999 Biomechanical lesion, unspecified: Secondary | ICD-10-CM | POA: Diagnosis not present

## 2014-01-12 DIAGNOSIS — M62838 Other muscle spasm: Secondary | ICD-10-CM | POA: Diagnosis not present

## 2014-01-22 DIAGNOSIS — M999 Biomechanical lesion, unspecified: Secondary | ICD-10-CM | POA: Diagnosis not present

## 2014-01-22 DIAGNOSIS — M5137 Other intervertebral disc degeneration, lumbosacral region: Secondary | ICD-10-CM | POA: Diagnosis not present

## 2014-01-22 DIAGNOSIS — M9981 Other biomechanical lesions of cervical region: Secondary | ICD-10-CM | POA: Diagnosis not present

## 2014-01-22 DIAGNOSIS — M62838 Other muscle spasm: Secondary | ICD-10-CM | POA: Diagnosis not present

## 2014-01-23 DIAGNOSIS — M9981 Other biomechanical lesions of cervical region: Secondary | ICD-10-CM | POA: Diagnosis not present

## 2014-01-23 DIAGNOSIS — M999 Biomechanical lesion, unspecified: Secondary | ICD-10-CM | POA: Diagnosis not present

## 2014-01-23 DIAGNOSIS — M5137 Other intervertebral disc degeneration, lumbosacral region: Secondary | ICD-10-CM | POA: Diagnosis not present

## 2014-01-23 DIAGNOSIS — M62838 Other muscle spasm: Secondary | ICD-10-CM | POA: Diagnosis not present

## 2014-01-25 ENCOUNTER — Ambulatory Visit (INDEPENDENT_AMBULATORY_CARE_PROVIDER_SITE_OTHER): Payer: Medicare Other | Admitting: Internal Medicine

## 2014-01-25 ENCOUNTER — Encounter: Payer: Self-pay | Admitting: Internal Medicine

## 2014-01-25 VITALS — BP 150/80 | Temp 98.9°F | Ht 59.5 in | Wt 126.0 lb

## 2014-01-25 DIAGNOSIS — E559 Vitamin D deficiency, unspecified: Secondary | ICD-10-CM | POA: Diagnosis not present

## 2014-01-25 DIAGNOSIS — R5381 Other malaise: Secondary | ICD-10-CM

## 2014-01-25 DIAGNOSIS — R5383 Other fatigue: Secondary | ICD-10-CM | POA: Diagnosis not present

## 2014-01-25 DIAGNOSIS — E538 Deficiency of other specified B group vitamins: Secondary | ICD-10-CM | POA: Diagnosis not present

## 2014-01-25 DIAGNOSIS — G4700 Insomnia, unspecified: Secondary | ICD-10-CM | POA: Diagnosis not present

## 2014-01-25 DIAGNOSIS — I10 Essential (primary) hypertension: Secondary | ICD-10-CM

## 2014-01-25 LAB — CBC WITH DIFFERENTIAL/PLATELET
BASOS ABS: 0 10*3/uL (ref 0.0–0.1)
Basophils Relative: 0.5 % (ref 0.0–3.0)
EOS ABS: 0.1 10*3/uL (ref 0.0–0.7)
Eosinophils Relative: 1.2 % (ref 0.0–5.0)
HCT: 38.6 % (ref 36.0–46.0)
Hemoglobin: 12.9 g/dL (ref 12.0–15.0)
LYMPHS PCT: 17 % (ref 12.0–46.0)
Lymphs Abs: 1.4 10*3/uL (ref 0.7–4.0)
MCHC: 33.3 g/dL (ref 30.0–36.0)
MCV: 93.2 fl (ref 78.0–100.0)
MONO ABS: 0.5 10*3/uL (ref 0.1–1.0)
Monocytes Relative: 6.7 % (ref 3.0–12.0)
NEUTROS ABS: 6.1 10*3/uL (ref 1.4–7.7)
NEUTROS PCT: 74.6 % (ref 43.0–77.0)
Platelets: 247 10*3/uL (ref 150.0–400.0)
RBC: 4.15 Mil/uL (ref 3.87–5.11)
RDW: 14 % (ref 11.5–15.5)
WBC: 8.1 10*3/uL (ref 4.0–10.5)

## 2014-01-25 LAB — TSH: TSH: 1.05 u[IU]/mL (ref 0.35–4.50)

## 2014-01-25 LAB — BASIC METABOLIC PANEL
BUN: 10 mg/dL (ref 6–23)
CO2: 24 mEq/L (ref 19–32)
CREATININE: 0.7 mg/dL (ref 0.4–1.2)
Calcium: 9.4 mg/dL (ref 8.4–10.5)
Chloride: 101 mEq/L (ref 96–112)
GFR: 86.11 mL/min (ref >60.00)
GLUCOSE: 95 mg/dL (ref 70–99)
Potassium: 4.3 mEq/L (ref 3.5–5.1)
SODIUM: 134 meq/L — AB (ref 135–145)

## 2014-01-25 LAB — VITAMIN B12: Vitamin B-12: 479 pg/mL (ref 211–911)

## 2014-01-25 NOTE — Patient Instructions (Addendum)
Try melatonin  Avoid alcohol Continue monitor BP readings to ensure control. Sleep hygiene no back lighting . Avoid walking all the way up.  dont wait until over tired to go to bed. Track   Sleep   And disruptions.   Will notify you  of labs when available.  considier seeing sleep specialist .

## 2014-01-25 NOTE — Progress Notes (Signed)
Chief Complaint  Patient presents with  . Fatigue  . Light Headed    HPI: Patient comes in today for SDA for  ongoingproblem evaluation. bp up and down off and on meds  But fine  At home  Yesterday 140 range takes atenolol in am and seems to do fine been on it for years  Has anxiety and labile bp readings and hx of migraine evaluated in past and at [facility] are good since taking magnesium .  C/OTIRED ALL THE TIME  And foggy.  Off and on no events   Some times spaced.  etoh and cut down  little  Trying to lose.weight  Probiotic  Vit d and multivitamin.supplements not new  Checked bp yesterday  150/80 and then second reading 140/ 130/  Had monitor.  Sleep isnt usually good. Up and down.  For last 6 months or so . Sleeps better at family house than at home.  Falling asleep ok but then awakens at 3 am and reads etc .  No osa rls sx  ROS: See pertinent positives and negatives per HPI.no cp sob syncope excess anxiety .  Ears are better after flushing   Past Medical History  Diagnosis Date  . Osteoarthritis   . Osteopenia   . Hypertension   . Allergic rhinitis   . Ocular migraine     Consult - [facility]  hospitalized  . Sarcoidosis   . Retinal tear     OS  . Scoliosis   . Chronic back pain   . Pulmonary nodule     [facility]  . Diverticulosis   . Cataract   . Agatston coronary artery calcium score less than 100 09/17/2012    4.4  lad 25%ile for age     Family History  Problem Relation Age of Onset  . Aortic aneurysm    . Stroke    . Colon cancer Neg Hx   . Heart disease Mother   . Lung cancer Father   . Arthritis Father   . Hyperlipidemia Sister   . Arthritis Sister   . Diabetes Brother   . Stroke Brother   . Hyperlipidemia Brother     History   Social History  . Marital Status: Widowed    Spouse Name: N/A    Number of Children: 6  . Years of Education: N/A   Occupational History  . retired    Social History Main Topics  . Smoking  status: Never Smoker   . Smokeless tobacco: Never Used  . Alcohol Use: Yes     Comment: Wine, red or white every night  . Drug Use: No  . Sexual Activity: None   Other Topics Concern  . 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                 Outpatient Encounter Prescriptions as of 01/25/2014  Medication Sig  . atenolol (TENORMIN) 50 MG tablet Take 1 tablet (50 mg total) by mouth daily.  . Cholecalciferol (VITAMIN D PO) Take 1,000 mg by mouth.  . Magnesium 250 MG TABS Take by mouth at bedtime.   . Multiple  Vitamins-Minerals (MULTIVITAMIN PO) Take by mouth.  . Probiotic Product (PROBIOTIC PO) Take by mouth.  . zolpidem (AMBIEN) 5 MG tablet Take 5 mg by mouth at bedtime as needed for sleep. For Travel    EXAM:  BP 150/80  Temp(Src) 98.9 F (37.2 C) (Oral)  Ht 4' 11.5" (1.511 m)  Wt 126 lb (57.153 kg)  BMI 25.03 kg/m2  Body mass index is 25.03 kg/(m^2).  GENERAL: vitals reviewed and listed above, alert, oriented, appears well hydrated and in no acute distress HEENT: atraumatic, conjunctiva  clear, no obvious abnormalities on inspection of external nose and ears NECK: no obvious masses on inspection palpation  CV: HRRR, no clubbing cyanosis or  peripheral edema nl cap refill   MS: moves all extremities without noticeable focal  abnormality PSYCH: pleasant and cooperative, no obvious depression or anxiety neuro grossly intact and nl Oriented x 3 and no noted deficits in memory, attention, and speech.  BP Readings from Last 3 Encounters:  01/25/14 150/80  01/09/14 116/82  01/05/14 158/78   Lab Results  Component Value Date   WBC 6.9 07/21/2011   HGB 13.4 07/21/2011   HCT 39.1 07/21/2011   PLT 234.0 07/21/2011   GLUCOSE 96 01/05/2014   CHOL 198  07/21/2011   TRIG 60.0 07/21/2011   HDL 76.80 07/21/2011   LDLDIRECT 120.7 05/02/2010   LDLCALC 109* 07/21/2011   ALT 20 07/21/2011   AST 25 07/21/2011   NA 133* 01/05/2014   K 3.9 01/05/2014   CL 99 01/05/2014   CREATININE 0.6 01/05/2014   BUN 12 01/05/2014   CO2 26 01/05/2014   TSH 1.71 07/21/2011   INR 1.0 07/23/2008   HGBA1C  Value: 5.7 (NOTE)   The ADA recommends the following therapeutic goal for glycemic   control related to Hgb A1C measurement:   Goal of Therapy:   < 7.0% Hgb A1C   Reference: American Diabetes Association: Clinical Practice   Recommendations 2008, Diabetes Care,  2008, 31:(Suppl 1). 07/23/2008     ASSESSMENT AND PLAN:  Discussed the following assessment and plan:  Tired - Plan: Vit D  25 hydroxy (rtn osteoporosis monitoring), Vitamin B12, CBC with Differential, TSH, Basic metabolic panel  Vitamin T01 nutritional deficiency - vegetarain  - Plan: Vit D  25 hydroxy (rtn osteoporosis monitoring), Vitamin B12, CBC with Differential, TSH, Basic metabolic panel  INSOMNIA - Plan: Vit D  25 hydroxy (rtn osteoporosis monitoring), Vitamin B12, CBC with Differential, TSH, Basic metabolic panel  Unspecified vitamin D deficiency - on ytoc meds no recnet level checked  - Plan: Vit D  25 hydroxy (rtn osteoporosis monitoring), Vitamin B12, CBC with Differential, TSH, Basic metabolic panel  Unspecified essential hypertension - labile meslty WC ok at home  Has been on b -blocker for a long  time    Fatigue and  Sleep  -Patient advised to return or notify health care team  if symptoms worsen ,persist or new concerns arise.  Patient Instructions  Try melatonin  Avoid alcohol Continue monitor BP readings to ensure control. Sleep hygiene no back lighting . Avoid walking all the way up.  dont wait until over tired to go to bed. Track   Sleep   And disruptions.   Will notify you  of labs when available.  considier seeing sleep specialist .   Kristin Jimenez. Kristin Jimenez M.D.  Pre visit review  using our clinic review tool, if applicable. No additional management support is needed unless otherwise documented below in the visit note.  Total visit 58mins > 50% spent counseling and coordinating care

## 2014-01-26 LAB — VITAMIN D 25 HYDROXY (VIT D DEFICIENCY, FRACTURES): VIT D 25 HYDROXY: 81 ng/mL (ref 30–89)

## 2014-02-13 ENCOUNTER — Ambulatory Visit (INDEPENDENT_AMBULATORY_CARE_PROVIDER_SITE_OTHER): Payer: Medicare Other | Admitting: Internal Medicine

## 2014-02-13 ENCOUNTER — Encounter: Payer: Self-pay | Admitting: Internal Medicine

## 2014-02-13 VITALS — BP 138/70 | Temp 98.7°F | Ht 59.5 in | Wt 127.0 lb

## 2014-02-13 DIAGNOSIS — R42 Dizziness and giddiness: Secondary | ICD-10-CM

## 2014-02-13 DIAGNOSIS — I10 Essential (primary) hypertension: Secondary | ICD-10-CM

## 2014-02-13 NOTE — Patient Instructions (Addendum)
i agree this sounds like vertigo .   Most common is benign positional  Vertigo and physical therapy can help.  However  We are going to get ENT to see you to help define this problem.  Continue BP monitoring as it seems to be controlled out of office .   Repeat bp 140/70 Vertigo Vertigo means you feel like you or your surroundings are moving when they are not. Vertigo can be dangerous if it occurs when you are at work, driving, or performing difficult activities.  CAUSES  Vertigo occurs when there is a conflict of signals sent to your brain from the visual and sensory systems in your body. There are many different causes of vertigo, including:  Infections, especially in the inner ear.  A bad reaction to a drug or misuse of alcohol and medicines.  Withdrawal from drugs or alcohol.  Rapidly changing positions, such as lying down or rolling over in bed.  A migraine headache.  Decreased blood flow to the brain.  Increased pressure in the brain from a head injury, infection, tumor, or bleeding. SYMPTOMS  You may feel as though the world is spinning around or you are falling to the ground. Because your balance is upset, vertigo can cause nausea and vomiting. You may have involuntary eye movements (nystagmus). DIAGNOSIS  Vertigo is usually diagnosed by physical exam. If the cause of your vertigo is unknown, your caregiver may perform imaging tests, such as an MRI scan (magnetic resonance imaging). TREATMENT  Most cases of vertigo resolve on their own, without treatment. Depending on the cause, your caregiver may prescribe certain medicines. If your vertigo is related to body position issues, your caregiver may recommend movements or procedures to correct the problem. In rare cases, if your vertigo is caused by certain inner ear problems, you may need surgery. HOME CARE INSTRUCTIONS   Follow your caregiver's instructions.  Avoid driving.  Avoid operating heavy machinery.  Avoid  performing any tasks that would be dangerous to you or others during a vertigo episode.  Tell your caregiver if you notice that certain medicines seem to be causing your vertigo. Some of the medicines used to treat vertigo episodes can actually make them worse in some people. SEEK IMMEDIATE MEDICAL CARE IF:   Your medicines do not relieve your vertigo or are making it worse.  You develop problems with talking, walking, weakness, or using your arms, hands, or legs.  You develop severe headaches.  Your nausea or vomiting continues or gets worse.  You develop visual changes.  A family member notices behavioral changes.  Your condition gets worse. MAKE SURE YOU:  Understand these instructions.  Will watch your condition.  Will get help right away if you are not doing well or get worse. Document Released: 06/03/2005 Document Revised: 11/16/2011 Document Reviewed: 03/12/2011 [facility] Patient Information 2014 [place].

## 2014-02-13 NOTE — Progress Notes (Signed)
Pre visit review using our clinic review tool, if applicable. No additional management support is needed unless otherwise documented below in the visit note.   Chief Complaint  Patient presents with  . Dizziness    Dizziness is worsening.  . Nausea    HPI:  Patient comes in today for SDA for  Ongoing  problem evaluation.  Spinning when roll over in bed and laying down.   vertigo spinning  nausea  Almost a month and then ok at times.    bp has been ok at home. 130 range and good   Episode turning over made worse but nausea and no vision change caution driving because of this . Mild ha off and on but not seem tobe assoc with migraines.  No hearing change numbness or weakness . To travel out of town next week until [place]. Tried yoga but make worse no falling  ? If allergies   Prefers no med unless rx cause. ROS: See pertinent positives and negatives per HPI. Remote hxof vertigo years ago and had scans  Not this bad as today . No cp sob syncope vision or hearing change Past Medical History  Diagnosis Date  . Osteoarthritis   . Osteopenia   . Hypertension   . Allergic rhinitis   . Ocular migraine     Consult - [facility]  hospitalized  . Sarcoidosis   . Retinal tear     OS  . Scoliosis   . Chronic back pain   . Pulmonary nodule     [facility]  . Diverticulosis   . Cataract   . Agatston coronary artery calcium score less than 100 09/17/2012    4.4  lad 25%ile for age     Family History  Problem Relation Age of Onset  . Aortic aneurysm    . Stroke    . Colon cancer Neg Hx   . Heart disease Mother   . Lung cancer Father   . Arthritis Father   . Hyperlipidemia Sister   . Arthritis Sister   . Diabetes Brother   . Stroke Brother   . Hyperlipidemia Brother     History   Social History  . Marital Status: Widowed    Spouse Name: N/A    Number of Children: 6  . Years of Education: N/A   Occupational History  . retired    Social History Main Topics  .  Smoking status: Never Smoker   . Smokeless tobacco: Never Used  . Alcohol Use: Yes     Comment: Wine, red or white every night  . Drug Use: No  . Sexual Activity: None   Other Topics Concern  . 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                 Outpatient Encounter Prescriptions as of 02/13/2014  Medication Sig  . atenolol (TENORMIN) 50 MG tablet Take 1 tablet (50 mg total) by mouth daily.  . Cholecalciferol (VITAMIN D PO) Take 1,000 mg by mouth.  . Magnesium 250 MG TABS Take by mouth at bedtime.   . Multiple Vitamins-Minerals (MULTIVITAMIN PO) Take by mouth.  . Probiotic  Product (PROBIOTIC PO) Take by mouth.  . zolpidem (AMBIEN) 5 MG tablet Take 5 mg by mouth at bedtime as needed for sleep. For Travel    EXAM:  BP 138/70  Temp(Src) 98.7 F (37.1 C) (Oral)  Ht 4' 11.5" (1.511 m)  Wt 127 lb (57.607 kg)  BMI 25.23 kg/m2  Body mass index is 25.23 kg/(m^2).  GENERAL: vitals reviewed and listed above, alert, oriented, appears well hydrated and in no acute distress HEENT: atraumatic, conjunctiva  clear, no obvious abnormalities on inspection of external nose and ears OP : no lesion edema or exudate  eoms intacts min nystagus with head turn to left ?  NECK: no obvious masses on inspection palpation no bruits heard  LUNGS: clear to auscultation bilaterally, no wheezes, rales or rhonc CV: HRRR, no clubbing cyanosis or  peripheral edema nl cap refill  Neuro non focal  Exam nl gati prefers to keep head abit still  MS: moves all extremities without noticeable focal  abnormality PSYCH: pleasant and cooperative, no obvious depression or anxiety  ASSESSMENT AND PLAN:  Discussed the following assessment and plan:  Vertigo - poss vestibular  but persisting  ? if BPV vs other  disc PT vs other eval will arrange ENT conslut driving injury prevention declined meds atthis time - Plan: Ambulatory referral to ENT  Unspecified essential hypertension - appears controlled up in office then better on repeat  -Patient advised to return or notify health care team  if symptoms worsen ,persist or new concerns arise.  Patient Instructions  i agree this sounds like vertigo .   Most common is benign positional  Vertigo and physical therapy can help.  However  We are going to get ENT to see you to help define this problem.  Continue BP monitoring as it seems to be controlled out of office .   Repeat bp 140/70 Vertigo Vertigo means you feel like you or your surroundings are moving when they are not. Vertigo can be dangerous if it occurs when you are at work, driving, or performing difficult activities.  CAUSES  Vertigo occurs when there is a conflict of signals sent to your brain from the visual and sensory systems in your body. There are many different causes of vertigo, including:  Infections, especially in the inner ear.  A bad reaction to a drug or misuse of alcohol and medicines.  Withdrawal from drugs or alcohol.  Rapidly changing positions, such as lying down or rolling over in bed.  A migraine headache.  Decreased blood flow to the brain.  Increased pressure in the brain from a head injury, infection, tumor, or bleeding. SYMPTOMS  You may feel as though the world is spinning around or you are falling to the ground. Because your balance is upset, vertigo can cause nausea and vomiting. You may have involuntary eye movements (nystagmus). DIAGNOSIS  Vertigo is usually diagnosed by physical exam. If the cause of your vertigo is unknown, your caregiver may perform imaging tests, such as an MRI scan (magnetic resonance imaging). TREATMENT  Most cases of vertigo resolve on their own, without treatment. Depending on the cause, your caregiver may  prescribe certain medicines. If your vertigo is related to body position issues, your caregiver may recommend movements or procedures to correct the problem. In rare cases, if your vertigo is caused by certain inner ear problems, you may need surgery. HOME CARE INSTRUCTIONS   Follow your caregiver's instructions.  Avoid driving.  Avoid operating heavy machinery.  Avoid performing  any tasks that would be dangerous to you or others during a vertigo episode.  Tell your caregiver if you notice that certain medicines seem to be causing your vertigo. Some of the medicines used to treat vertigo episodes can actually make them worse in some people. SEEK IMMEDIATE MEDICAL CARE IF:   Your medicines do not relieve your vertigo or are making it worse.  You develop problems with talking, walking, weakness, or using your arms, hands, or legs.  You develop severe headaches.  Your nausea or vomiting continues or gets worse.  You develop visual changes.  A family member notices behavioral changes.  Your condition gets worse. MAKE SURE YOU:  Understand these instructions.  Will watch your condition.  Will get help right away if you are not doing well or get worse. Document Released: 06/03/2005 Document Revised: 11/16/2011 Document Reviewed: 03/12/2011 [facility] Patient Information 2014 [place].         Standley Brooking. Panosh M.D.

## 2014-02-20 DIAGNOSIS — H8110 Benign paroxysmal vertigo, unspecified ear: Secondary | ICD-10-CM | POA: Diagnosis not present

## 2014-03-28 ENCOUNTER — Telehealth: Payer: Self-pay | Admitting: Internal Medicine

## 2014-03-28 NOTE — Telephone Encounter (Signed)
Pt is needing to know what her vitamin d level is, states she did not write it down when you called and provided her with her results. Pt states she saw a nutritionist and they had ask for it.

## 2014-03-29 NOTE — Telephone Encounter (Signed)
Spoke to the pt.  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  Does not need anything else at this time.

## 2014-04-13 DIAGNOSIS — D235 Other benign neoplasm of skin of trunk: Secondary | ICD-10-CM | POA: Diagnosis not present

## 2014-04-13 DIAGNOSIS — D485 Neoplasm of uncertain behavior of skin: Secondary | ICD-10-CM | POA: Diagnosis not present

## 2014-05-07 DIAGNOSIS — Z961 Presence of intraocular lens: Secondary | ICD-10-CM | POA: Diagnosis not present

## 2014-06-04 ENCOUNTER — Ambulatory Visit (INDEPENDENT_AMBULATORY_CARE_PROVIDER_SITE_OTHER): Payer: Medicare Other | Admitting: Internal Medicine

## 2014-06-04 ENCOUNTER — Encounter: Payer: Self-pay | Admitting: Internal Medicine

## 2014-06-04 VITALS — BP 178/80 | Temp 99.1°F | Wt 123.0 lb

## 2014-06-04 DIAGNOSIS — E2839 Other primary ovarian failure: Secondary | ICD-10-CM

## 2014-06-04 DIAGNOSIS — R350 Frequency of micturition: Secondary | ICD-10-CM

## 2014-06-04 DIAGNOSIS — N952 Postmenopausal atrophic vaginitis: Secondary | ICD-10-CM | POA: Diagnosis not present

## 2014-06-04 DIAGNOSIS — R300 Dysuria: Secondary | ICD-10-CM

## 2014-06-04 LAB — POCT URINALYSIS DIPSTICK
Bilirubin, UA: NEGATIVE
GLUCOSE UA: NEGATIVE
Ketones, UA: NEGATIVE
Nitrite, UA: NEGATIVE
PH UA: 6
Protein, UA: NEGATIVE
RBC UA: 2
Spec Grav, UA: <=1.005
UROBILINOGEN UA: 0.2

## 2014-06-04 MED ORDER — CIPROFLOXACIN HCL 500 MG PO TABS: 500.0000 mg | ORAL_TABLET | Freq: Two times a day (BID) | ORAL | Status: DC

## 2014-06-04 MED ORDER — ESTRADIOL 0.1 MG/GM VA CREA: TOPICAL_CREAM | VAGINAL | Status: DC

## 2014-06-04 NOTE — Progress Notes (Signed)
Pre visit review using our clinic review tool, if applicable. No additional management support is needed unless otherwise documented below in the visit note.  Chief Complaint  Patient presents with  . Dysuria    Started on Saturday.  . Urinary Frequency    HPI: Patient Kristin Jimenez  comes in today for SDA for  new problem evaluation. Doesn't ever remember having a UTI but over the last 2 days has had increasing urgency frequency pain over her bladder and a tinge of blood. Recently new relationship had some vaginal pain doesn't remember this before.  No real discharge used to be on hormone replacement in the remote past. No vaginal discharge or lesions.  Blood pressure very good at home in the teens and 120s  just elevates when she comes in.  ROS: See pertinent positives and negatives per HPI. No fever chills vomiting has some low back pain but this is not new Past Medical History  Diagnosis Date  . Osteoarthritis   . Osteopenia   . Hypertension   . Allergic rhinitis   . Ocular migraine     Consult - [facility]  hospitalized  . Sarcoidosis   . Retinal tear     OS  . Scoliosis   . Chronic back pain   . Pulmonary nodule     [facility]  . Diverticulosis   . Cataract   . Agatston coronary artery calcium score less than 100 09/17/2012    4.4  lad 25%ile for age     Family History  Problem Relation Age of Onset  . Aortic aneurysm    . Stroke    . Colon cancer Neg Hx   . Heart disease Mother   . Lung cancer Father   . Arthritis Father   . Hyperlipidemia Sister   . Arthritis Sister   . Diabetes Brother   . Stroke Brother   . Hyperlipidemia Brother     History   Social History  . Marital Status: Widowed    Spouse Name: N/A    Number of Children: 6  . Years of Education: N/A   Occupational History  . retired    Social History Main Topics  . Smoking status: Never Smoker   . Smokeless tobacco: Never Used  . Alcohol Use: Yes     Comment: Wine,  red or white every night  . Drug Use: No  . Sexual Activity: None   Other Topics Concern  . 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                 Outpatient Encounter Prescriptions as of 06/04/2014  Medication Sig  . Magnesium 250 MG TABS Take by mouth at bedtime.   . Multiple Vitamins-Minerals (MULTIVITAMIN PO) Take by mouth.  . Probiotic Product (PROBIOTIC PO) Take by mouth.  . ciprofloxacin (CIPRO) 500 MG tablet Take 1 tablet (500 mg total) by mouth 2 (two) times daily.  Marland Kitchen estradiol (ESTRACE) 0.1 MG/GM vaginal cream Use 0.25 -0.5 gram For 2 weeks intravaginally  Then  3 x per week  . [DISCONTINUED] atenolol (TENORMIN) 50 MG tablet Take 1 tablet (50 mg total) by mouth  daily.  . [DISCONTINUED] Cholecalciferol (VITAMIN D PO) Take 1,000 mg by mouth.  . [DISCONTINUED] zolpidem (AMBIEN) 5 MG tablet Take 5 mg by mouth at bedtime as needed for sleep. For Travel    EXAM:  BP 178/80  Temp(Src) 99.1 F (37.3 C) (Oral)  Wt 123 lb (55.792 kg)  Body mass index is 24.44 kg/(m^2).  GENERAL: vitals reviewed and listed above, alert, oriented, appears well hydrated and in no acute distress looks well today. HEENT: atraumatic, conjunctiva  clear, no obvious abnormalities on inspection of external nose and ears  NECK: no obvious masses on inspection palpation  LUNGS: clear to auscultation bilaterally, no wheezes, rales or rhonchit CV: HRRR, no clubbing cyanosis or  peripheral edema nl cap refill  Abdomen soft without organomegaly guarding or rebound no flank pain there is some tenderness in the suprapubic area but no mass effect. MS: moves all extremities without noticeable focal  abnormality PSYCH: pleasant and cooperative, no obvious depression or anxiety UA  positive for leukocytes and blood. ASSESSMENT AND PLAN:  Discussed the following assessment and plan:  Dysuria - Plan: POC Urinalysis Dipstick, Culture, Urine  Urinary frequency - Plan: POC Urinalysis Dipstick, Culture, Urine  Vaginal atrophy  Estrogen deficiency Probable UTI cystitis otherwise uncomplicated. Probable atrophic vaginitis by history can do exam and followup especially if not getting better. Discussed risk benefit of topical estrogens. Also treatment for UTIs prevention etc. Empiric treatment Cipro 500 twice a day for 3 days contact us if she gets side effects. -Patient advised to return or notify health care team  if symptoms worsen ,persist or new concerns arise.  Patient Instructions   Antibiotic for 3 days  Will let you know culture  resulsts when avialable. Can try  otc pyridium for bladder pain if needed until better . Can try estrogen vaginal cream every night for 2 weeks and then 3 x per week or as needed .   Urinary Tract Infection Urinary tract infections (UTIs) can develop anywhere along your urinary tract. Your urinary tract is your body's drainage system for removing wastes and extra water. Your urinary tract includes two kidneys, two ureters, a bladder, and a urethra. Your kidneys are a pair of bean-shaped organs. Each kidney is about the size of your fist. They are located below your ribs, one on each side of your spine. CAUSES Infections are caused by microbes, which are microscopic organisms, including fungi, viruses, and bacteria. These organisms are so small that they can only be seen through a microscope. Bacteria are the microbes that most commonly cause UTIs. SYMPTOMS  Symptoms of UTIs may vary by age and gender of the patient and by the location of the infection. Symptoms in young women typically include a frequent and intense urge to urinate and a painful, burning feeling in the bladder or urethra during urination. Older women and men are more likely  to be tired, shaky, and weak and have muscle aches and abdominal pain. A fever may mean the infection is in your kidneys. Other symptoms of a kidney infection include pain in your back or sides below the ribs, nausea, and vomiting. DIAGNOSIS To diagnose a UTI, your caregiver will ask you about your symptoms. Your caregiver also will ask to provide a urine sample. The urine sample will be tested for bacteria and white blood cells. White blood cells are made by your body to help fight infection. TREATMENT  Typically, UTIs can be treated with medication. Because most UTIs are caused by a bacterial  infection, they usually can be treated with the use of antibiotics. The choice of antibiotic and length of treatment depend on your symptoms and the type of bacteria causing your infection. HOME CARE INSTRUCTIONS  If you were prescribed antibiotics, take them exactly as your caregiver instructs you. Finish the medication even if you feel better after you have only taken some of the medication.  Drink enough water and fluids to keep your urine clear or pale yellow.  Avoid caffeine, tea, and carbonated beverages. They tend to irritate your bladder.  Empty your bladder often. Avoid holding urine for long periods of time.  Empty your bladder before and after sexual intercourse.  After a bowel movement, women should cleanse from front to back. Use each tissue only once. SEEK MEDICAL CARE IF:   You have back pain.  You develop a fever.  Your symptoms do not begin to resolve within 3 days. SEEK IMMEDIATE MEDICAL CARE IF:   You have severe back pain or lower abdominal pain.  You develop chills.  You have nausea or vomiting.  You have continued burning or discomfort with urination. MAKE SURE YOU:   Understand these instructions.  Will watch your condition.  Will get help right away if you are not doing well or get worse. Document Released: 06/03/2005 Document Revised: 02/23/2012 Document  Reviewed: 10/02/2011 [facility] Patient Information 2015 [place], [place]. This information is not intended to replace advice given to you by your health care provider. Make sure you discuss any questions you have with your health care provider. Atrophic Vaginitis Atrophic vaginitis is a problem of low levels of estrogen in women. This problem can happen at any age. It is most common in women who have gone through menopause ("the change").  HOW WILL I KNOW IF I HAVE THIS PROBLEM? You may have:  Trouble with peeing (urinating), such as:  Going to the bathroom often.  A hard time holding your pee until you reach a bathroom.  Leaking pee.  Having pain when you pee.  Itching or a burning feeling.  Vaginal bleeding and spotting.  Pain during sex.  Dryness of the vagina.  A yellow, bad-smelling fluid (discharge) coming from the vagina. HOW WILL MY DOCTOR CHECK FOR THIS PROBLEM?  During your exam, your doctor will likely find the problem.  If there is a vaginal fluid, it may be checked for infection. HOW WILL THIS PROBLEM BE TREATED? Keep the vulvar skin as clean as possible. Moisturizers and lubricants can help with some of the symptoms. Estrogen replacement can help. There are 2 ways to take estrogen:  Systemic estrogen gets estrogen to your whole body. It takes many weeks or months before the symptoms get better.  You take an estrogen pill.  You use a skin patch. This is a patch that you put on your skin.  If you still have your uterus, your doctor may ask you to take a hormone. Talk to your doctor about the right medicine for you.  Estrogen cream.  This puts estrogen only at the part of your body where you apply it. The cream is put into the vagina or put on the vulvar skin. For some women, estrogen cream works faster than pills or the patch. CAN ALL WOMEN WITH THIS PROBLEM USE ESTROGEN? No. Women with certain types of cancer, liver problems, or problems with blood clots should  not take estrogen. Your doctor can help you decide the best treatment for your symptoms. Document Released: 02/10/2008 Document Revised: 08/29/2013 Document  Reviewed: 02/10/2008 [employer] Patient Information 2015 [place], [place]. This information is not intended to replace advice given to you by your health care provider. Make sure you discuss any questions you have with your health care provider.       Standley Brooking. Panosh M.D.

## 2014-06-04 NOTE — Patient Instructions (Addendum)
Antibiotic for 3 days  Will let you know culture  resulsts when avialable. Can try  otc pyridium for bladder pain if needed until better . Can try estrogen vaginal cream every night for 2 weeks and then 3 x per week or as needed .   Urinary Tract Infection Urinary tract infections (UTIs) can develop anywhere along your urinary tract. Your urinary tract is your body's drainage system for removing wastes and extra water. Your urinary tract includes two kidneys, two ureters, a bladder, and a urethra. Your kidneys are a pair of bean-shaped organs. Each kidney is about the size of your fist. They are located below your ribs, one on each side of your spine. CAUSES Infections are caused by microbes, which are microscopic organisms, including fungi, viruses, and bacteria. These organisms are so small that they can only be seen through a microscope. Bacteria are the microbes that most commonly cause UTIs. SYMPTOMS  Symptoms of UTIs may vary by age and gender of the patient and by the location of the infection. Symptoms in young women typically include a frequent and intense urge to urinate and a painful, burning feeling in the bladder or urethra during urination. Older women and men are more likely to be tired, shaky, and weak and have muscle aches and abdominal pain. A fever may mean the infection is in your kidneys. Other symptoms of a kidney infection include pain in your back or sides below the ribs, nausea, and vomiting. DIAGNOSIS To diagnose a UTI, your caregiver will ask you about your symptoms. Your caregiver also will ask to provide a urine sample. The urine sample will be tested for bacteria and white blood cells. White blood cells are made by your body to help fight infection. TREATMENT  Typically, UTIs can be treated with medication. Because most UTIs are caused by a bacterial infection, they usually can be treated with the use of antibiotics. The choice of antibiotic and length of treatment  depend on your symptoms and the type of bacteria causing your infection. HOME CARE INSTRUCTIONS  If you were prescribed antibiotics, take them exactly as your caregiver instructs you. Finish the medication even if you feel better after you have only taken some of the medication.  Drink enough water and fluids to keep your urine clear or pale yellow.  Avoid caffeine, tea, and carbonated beverages. They tend to irritate your bladder.  Empty your bladder often. Avoid holding urine for long periods of time.  Empty your bladder before and after sexual intercourse.  After a bowel movement, women should cleanse from front to back. Use each tissue only once. SEEK MEDICAL CARE IF:   You have back pain.  You develop a fever.  Your symptoms do not begin to resolve within 3 days. SEEK IMMEDIATE MEDICAL CARE IF:   You have severe back pain or lower abdominal pain.  You develop chills.  You have nausea or vomiting.  You have continued burning or discomfort with urination. MAKE SURE YOU:   Understand these instructions.  Will watch your condition.  Will get help right away if you are not doing well or get worse. Document Released: 06/03/2005 Document Revised: 02/23/2012 Document Reviewed: 10/02/2011 [facility] Patient Information 2015 [place], [place]. This information is not intended to replace advice given to you by your health care provider. Make sure you discuss any questions you have with your health care provider. Atrophic Vaginitis Atrophic vaginitis is a problem of low levels of estrogen in women. This problem can happen  at any age. It is most common in women who have gone through menopause ("the change").  HOW WILL I KNOW IF I HAVE THIS PROBLEM? You may have:  Trouble with peeing (urinating), such as:  Going to the bathroom often.  A hard time holding your pee until you reach a bathroom.  Leaking pee.  Having pain when you pee.  Itching or a burning feeling.  Vaginal  bleeding and spotting.  Pain during sex.  Dryness of the vagina.  A yellow, bad-smelling fluid (discharge) coming from the vagina. HOW WILL MY DOCTOR CHECK FOR THIS PROBLEM?  During your exam, your doctor will likely find the problem.  If there is a vaginal fluid, it may be checked for infection. HOW WILL THIS PROBLEM BE TREATED? Keep the vulvar skin as clean as possible. Moisturizers and lubricants can help with some of the symptoms. Estrogen replacement can help. There are 2 ways to take estrogen:  Systemic estrogen gets estrogen to your whole body. It takes many weeks or months before the symptoms get better.  You take an estrogen pill.  You use a skin patch. This is a patch that you put on your skin.  If you still have your uterus, your doctor may ask you to take a hormone. Talk to your doctor about the right medicine for you.  Estrogen cream.  This puts estrogen only at the part of your body where you apply it. The cream is put into the vagina or put on the vulvar skin. For some women, estrogen cream works faster than pills or the patch. CAN ALL WOMEN WITH THIS PROBLEM USE ESTROGEN? No. Women with certain types of cancer, liver problems, or problems with blood clots should not take estrogen. Your doctor can help you decide the best treatment for your symptoms. Document Released: 02/10/2008 Document Revised: 08/29/2013 Document Reviewed: 02/10/2008 [facility] Patient Information 2015 [place], [place]. This information is not intended to replace advice given to you by your health care provider. Make sure you discuss any questions you have with your health care provider.

## 2014-06-05 ENCOUNTER — Telehealth: Payer: Self-pay | Admitting: Internal Medicine

## 2014-06-05 NOTE — Telephone Encounter (Signed)
Pt called to say that the rx   estradiol (ESTRACE) 0.1 MG/GM vaginal cream her insurance will not pay    Pt is requesting the patch her insurance will pay for part of it   Pharmacy ; [facility] on [address]

## 2014-06-05 NOTE — Telephone Encounter (Signed)
Do not feel comfortable at this time rxing systemic estrogen with hx of classic migraines riskof stroke.  Would ask if any topical estrogens are covered .

## 2014-06-06 NOTE — Telephone Encounter (Signed)
LMOM for the pt to return my call.

## 2014-06-07 LAB — URINE CULTURE: Colony Count: >=100000

## 2014-06-07 NOTE — Telephone Encounter (Signed)
Spoke to the pt.  She would like to for Dr. Regis Bill to send in a topical estrogen that she thinks is best.  Please advise.  Thanks!

## 2014-06-07 NOTE — Progress Notes (Signed)
Quick Note:  Tell patient that urine culture shows e coli sensitive to medication given . Should resolve with current treatment .FU if not better. ______ 

## 2014-06-15 DIAGNOSIS — M546 Pain in thoracic spine: Secondary | ICD-10-CM | POA: Diagnosis not present

## 2014-06-15 DIAGNOSIS — R911 Solitary pulmonary nodule: Secondary | ICD-10-CM | POA: Diagnosis not present

## 2014-06-15 DIAGNOSIS — M549 Dorsalgia, unspecified: Secondary | ICD-10-CM | POA: Diagnosis not present

## 2014-06-15 DIAGNOSIS — K449 Diaphragmatic hernia without obstruction or gangrene: Secondary | ICD-10-CM | POA: Diagnosis not present

## 2014-06-15 DIAGNOSIS — R079 Chest pain, unspecified: Secondary | ICD-10-CM | POA: Diagnosis not present

## 2014-06-18 NOTE — Telephone Encounter (Signed)
Uncertain if i still need to respond to this  estraace and premarin creams are both good  Estradiol usually less expensive

## 2014-06-22 MED ORDER — ESTROGENS, CONJUGATED 0.625 MG/GM VA CREA: TOPICAL_CREAM | VAGINAL | Status: DC

## 2014-06-22 NOTE — Telephone Encounter (Signed)
Per WP, use Premarin.  Sent to the pharmacy by e-scribe.

## 2014-06-22 NOTE — Addendum Note (Signed)
Addended by: Miles Costain T on: 06/22/2014 04:20 PM   Modules accepted: Orders

## 2014-08-21 ENCOUNTER — Encounter: Payer: Self-pay | Admitting: Family Medicine

## 2014-08-21 ENCOUNTER — Ambulatory Visit (INDEPENDENT_AMBULATORY_CARE_PROVIDER_SITE_OTHER): Payer: Medicare Other | Admitting: Family Medicine

## 2014-08-21 VITALS — BP 130/90 | HR 90 | Temp 97.6°F | Ht 59.25 in | Wt 125.7 lb

## 2014-08-21 DIAGNOSIS — R300 Dysuria: Secondary | ICD-10-CM

## 2014-08-21 LAB — POCT URINALYSIS DIPSTICK
Bilirubin, UA: NEGATIVE
Glucose, UA: NEGATIVE
Ketones, UA: NEGATIVE
Nitrite, UA: NEGATIVE
Protein, UA: NEGATIVE
Spec Grav, UA: 1.02
Urobilinogen, UA: 0.2
pH, UA: 5.5

## 2014-08-21 MED ORDER — CIPROFLOXACIN HCL 250 MG PO TABS: 250.0000 mg | ORAL_TABLET | Freq: Two times a day (BID) | ORAL | Status: DC

## 2014-08-21 NOTE — Addendum Note (Signed)
Addended by: Colleen Can on: 08/21/2014 02:56 PM   Modules accepted: Orders

## 2014-08-21 NOTE — Progress Notes (Signed)
HPI:  Acute visit for:  1) Dysuria: -thinks she has a UTI -symptoms: dysuria mild a few days ago - drank more water and now feeling better without any symptoms today but wanted to check urine -denies:flak pain, vaginal bleeding, pelvic or plank pain, NVD, hematuria -hx of: UTI per her report and had one a few months ago on [facility]  ROS: See pertinent positives and negatives per HPI.  Past Medical History  Diagnosis Date  . Osteoarthritis   . Osteopenia   . Hypertension   . Allergic rhinitis   . Ocular migraine     Consult - [facility]  hospitalized  . Sarcoidosis   . Retinal tear     OS  . Scoliosis   . Chronic back pain   . Pulmonary nodule     [facility]  . Diverticulosis   . Cataract   . Agatston coronary artery calcium score less than 100 09/17/2012    4.4  lad 25%ile for age     Past Surgical History  Procedure Laterality Date  . Abdominal hysterectomy    . Bilateral salpingoophorectomy    . Back surgery      x 2  . Cataract extraction  12/11    Family History  Problem Relation Age of Onset  . Aortic aneurysm    . Stroke    . Colon cancer Neg Hx   . Heart disease Mother   . Lung cancer Father   . Arthritis Father   . Hyperlipidemia Sister   . Arthritis Sister   . Diabetes Brother   . Stroke Brother   . Hyperlipidemia Brother     History   Social History  . Marital Status: Widowed    Spouse Name: N/A    Number of Children: 6  . Years of Education: N/A   Occupational History  . retired    Social History Main Topics  . Smoking status: Never Smoker   . Smokeless tobacco: Never Used  . Alcohol Use: Yes     Comment: Wine, red or white every night  . Drug Use: No  . Sexual Activity: None   Other Topics Concern  . 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                 Current outpatient prescriptions: Cholecalciferol (VITAMIN D PO), Take by mouth daily., Disp: , Rfl: ;  Magnesium 250 MG TABS, Take by mouth at bedtime. , Disp: , Rfl: ;  Multiple Vitamins-Minerals (MULTIVITAMIN PO), Take by mouth., Disp: , Rfl: ;  Probiotic Product (PROBIOTIC PO), Take by mouth., Disp: , Rfl:   EXAM:  Filed Vitals:   08/21/14 1304  BP: 130/90  Pulse: 90  Temp: 97.6 F (36.4 C)    Body mass index is 25.17 kg/(m^2).  GENERAL: vitals reviewed and listed above, alert, oriented, appears well hydrated and in no acute distress  HEENT: atraumatic, conjunttiva clear, no obvious abnormalities on inspection of external nose and ears  NECK: no obvious masses on inspection  LUNGS: clear to auscultation bilaterally, no wheezes, rales or rhonchi, good air movement  CV: HRRR, no peripheral edema  ABD: no  CVA TTP  MS: moves all extremities without noticeable abnormality  PSYCH: pleasant and cooperative, no obvious depression or anxiety  ASSESSMENT AND PLAN:  Discussed the following assessment and plan:  Dysuria  -udip with blood and leuks, she opted for empiric tx with cipro and culture pending given hx -advised follow up with PCP if recurrent or persistent symptoms regardless -discuss causes and preventive strategies -Patient advised to return or notify a doctor immediately if symptoms worsen or persist or new concerns arise.  There are no Patient Instructions on file for this visit.   Colin Benton R.

## 2014-08-21 NOTE — Progress Notes (Signed)
Pre visit review using our clinic review tool, if applicable. No additional management support is needed unless otherwise documented below in the visit note. 

## 2014-08-24 LAB — URINE CULTURE

## 2014-09-04 ENCOUNTER — Other Ambulatory Visit: Payer: Self-pay

## 2014-09-04 DIAGNOSIS — Z1231 Encounter for screening mammogram for malignant neoplasm of breast: Secondary | ICD-10-CM

## 2014-10-01 ENCOUNTER — Ambulatory Visit: Payer: PRIVATE HEALTH INSURANCE

## 2014-10-02 ENCOUNTER — Ambulatory Visit
Admission: RE | Admit: 2014-10-02 | Discharge: 2014-10-02 | Disposition: A | Discharge status: Home / Self Care | Payer: PRIVATE HEALTH INSURANCE | Source: Ambulatory Visit

## 2014-10-02 DIAGNOSIS — Z1231 Encounter for screening mammogram for malignant neoplasm of breast: Secondary | ICD-10-CM | POA: Diagnosis not present

## 2014-10-08 ENCOUNTER — Telehealth: Payer: Self-pay | Admitting: Internal Medicine

## 2014-10-08 NOTE — Telephone Encounter (Signed)
Error pt changed mine about message

## 2014-10-09 ENCOUNTER — Other Ambulatory Visit (INDEPENDENT_AMBULATORY_CARE_PROVIDER_SITE_OTHER): Payer: Medicare Other

## 2014-10-09 DIAGNOSIS — O10019 Pre-existing essential hypertension complicating pregnancy, unspecified trimester: Secondary | ICD-10-CM

## 2014-10-09 DIAGNOSIS — Z0000 Encounter for general adult medical examination without abnormal findings: Secondary | ICD-10-CM | POA: Diagnosis not present

## 2014-10-09 DIAGNOSIS — R5383 Other fatigue: Secondary | ICD-10-CM

## 2014-10-09 LAB — CBC WITH DIFFERENTIAL/PLATELET
BASOS PCT: 0.8 % (ref 0.0–3.0)
Basophils Absolute: 0 10*3/uL (ref 0.0–0.1)
EOS PCT: 1.3 % (ref 0.0–5.0)
Eosinophils Absolute: 0.1 10*3/uL (ref 0.0–0.7)
HEMATOCRIT: 39.1 % (ref 36.0–46.0)
Hemoglobin: 13.2 g/dL (ref 12.0–15.0)
LYMPHS PCT: 29 % (ref 12.0–46.0)
Lymphs Abs: 1.6 10*3/uL (ref 0.7–4.0)
MCHC: 33.8 g/dL (ref 30.0–36.0)
MCV: 91.1 fl (ref 78.0–100.0)
MONO ABS: 0.5 10*3/uL (ref 0.1–1.0)
Monocytes Relative: 8.8 % (ref 3.0–12.0)
Neutro Abs: 3.3 10*3/uL (ref 1.4–7.7)
Neutrophils Relative %: 60.1 % (ref 43.0–77.0)
Platelets: 224 10*3/uL (ref 150.0–400.0)
RBC: 4.29 Mil/uL (ref 3.87–5.11)
RDW: 13.8 % (ref 11.5–15.5)
WBC: 5.4 10*3/uL (ref 4.0–10.5)

## 2014-10-09 LAB — COMPREHENSIVE METABOLIC PANEL
ALT: 13 U/L (ref 0–35)
AST: 19 U/L (ref 0–37)
Albumin: 4.2 g/dL (ref 3.5–5.2)
Alkaline Phosphatase: 102 U/L (ref 39–117)
BUN: 13 mg/dL (ref 6–23)
CO2: 27 mEq/L (ref 19–32)
Calcium: 9.5 mg/dL (ref 8.4–10.5)
Chloride: 102 mEq/L (ref 96–112)
Creatinine, Ser: 0.67 mg/dL (ref 0.40–1.20)
GFR: 90.41 mL/min (ref >60.00)
Glucose, Bld: 91 mg/dL (ref 70–99)
POTASSIUM: 4.4 meq/L (ref 3.5–5.1)
Sodium: 136 mEq/L (ref 135–145)
Total Bilirubin: 1 mg/dL (ref 0.2–1.2)
Total Protein: 6.5 g/dL (ref 6.0–8.3)

## 2014-10-09 LAB — LIPID PANEL
Cholesterol: 183 mg/dL (ref 0–200)
HDL: 72.4 mg/dL (ref >39.00)
LDL Cholesterol: 91 mg/dL (ref 0–99)
NonHDL: 110.6
Total CHOL/HDL Ratio: 3
Triglycerides: 99 mg/dL (ref 0.0–149.0)
VLDL: 19.8 mg/dL (ref 0.0–40.0)

## 2014-10-09 LAB — TSH: TSH: 2.81 u[IU]/mL (ref 0.35–4.50)

## 2014-10-15 ENCOUNTER — Ambulatory Visit (INDEPENDENT_AMBULATORY_CARE_PROVIDER_SITE_OTHER): Payer: Medicare Other | Admitting: Internal Medicine

## 2014-10-15 ENCOUNTER — Encounter: Payer: Self-pay | Admitting: Internal Medicine

## 2014-10-15 DIAGNOSIS — Z0000 Encounter for general adult medical examination without abnormal findings: Secondary | ICD-10-CM | POA: Diagnosis not present

## 2014-10-15 NOTE — Progress Notes (Signed)
Pre visit review using our clinic review tool, if applicable. No additional management support is needed unless otherwise documented below in the visit note.  Chief Complaint  Patient presents with  . Medicare Wellness    HPI: Patient comes in today for Preventive Medicare wellness visit . Doing" detox "  With gluten ffree protein  And  Fruits   For 10 days  No meds  Exercising  Feels well  No major injuries, ed visits ,hospitalizations , new medications since last visit.  Health Maintenance  Topic Date Due  . ZOSTAVAX  06/25/1996  . DEXA SCAN  06/25/2001  . INFLUENZA VACCINE  04/07/2014  . COLONOSCOPY  07/06/2021  . TETANUS/TDAP  05/13/2023  . PNEUMOCOCCAL POLYSACCHARIDE VACCINE AGE 60 AND OVER  Completed   Health Maintenance Review  LIFESTYLE:  Exercise:  Yoga and water aerobics  Tobacco/ETS: Alcohol: less than one a day  Sugar beverages: none  Sleep:  About  8 - 9  Drug use: no Bone density:  Colonoscopy:  [facility] 2012  See SCANNED  HX DOCUMENT   Hearing:  Doing well   Vision:  No limitations at present . Last eye check UTD  Safety:  Has smoke detector and wears seat belts.  No firearms. No excess sun exposure. Sees dentist regularly.  Falls:  no  Advance directive :  Reviewed  Has one.  Memory: Felt to be good  , no concern from her or her family. ocass naming lapses temporary.   Depression: No anhedonia unusual crying or depressive symptoms  Nutrition: Eats well balanced diet; adequate calcium and vitamin D. No swallowing chewing problems.  Injury: no major injuries in the last six months.  Other healthcare providers:  Reviewed today .  Social:  Widowed Lives alone loft above kids .  No pets.   New relationship dating   Preventive parameters: up-to-date  Reviewed   ADLS:   There are no problems or need for assistance  driving, feeding, obtaining food, dressing, toileting and bathing, managing money using phone. She is independent.   ROS:  GEN/  HEENT: No fever, significant weight changes sweats headaches vision problems hearing changes, CV/ PULM; No chest pain shortness of breath cough, syncope,edema  change in exercise tolerance. GI /GU: No adominal pain, vomiting, change in bowel habits. No blood in the stool. No significant GU symptoms. SKIN/HEME: ,no acute skin rashes suspicious lesions or bleeding. No lymphadenopathy, nodules, masses.  NEURO/ PSYCH:  No neurologic signs such as weakness numbness. No depression anxiety. IMM/ Allergy: No unusual infections.  Allergy .   REST of 12 system review negative except as per HPI   Past Medical History  Diagnosis Date  . Osteoarthritis   . Osteopenia   . Hypertension   . Allergic rhinitis   . Ocular migraine     Consult - [facility]  hospitalized  . Sarcoidosis   . Retinal tear     OS  . Scoliosis   . Chronic back pain   . Pulmonary nodule     [facility]  . Diverticulosis   . Cataract   . Agatston coronary artery calcium score less than 100 09/17/2012    4.4  lad 25%ile for age     Family History  Problem Relation Age of Onset  . Aortic aneurysm    . Stroke    . Colon cancer Neg Hx   . Heart disease Mother   . Lung cancer Father   . Arthritis Father   .  Hyperlipidemia Sister   . Arthritis Sister   . Diabetes Brother   . Stroke Brother   . Hyperlipidemia Brother     History   Social History  . Marital Status: Widowed    Spouse Name: N/A    Number of Children: 6  . Years of Education: N/A   Occupational History  . retired    Social History Main Topics  . Smoking status: Never Smoker   . Smokeless tobacco: Never Used  . Alcohol Use: Yes     Comment: Wine, red or white every night  . Drug Use: No  . Sexual Activity: None   Other Topics Concern  . None   Social History Narrative   CONSULTS   Dr Leonie Man - neuro in ED  Now Dr Brett Fairy      Retired,  Lives  daughters garage  apt   Widowed, husband passed away in Dec 22, 2007  Vegetarian  mostly   Daily caffeine use   Regular exercise - YES   Plans to travel.                 Outpatient Encounter Prescriptions as of 10/15/2014  Medication Sig  . Cholecalciferol (VITAMIN D PO) Take 500 Units by mouth daily.   . Magnesium 250 MG TABS Take by mouth at bedtime.   . Multiple Vitamins-Minerals (MULTIVITAMIN PO) Take by mouth.  . Probiotic Product (PROBIOTIC PO) Take by mouth.  . [DISCONTINUED] ciprofloxacin (CIPRO) 250 MG tablet Take 1 tablet (250 mg total) by mouth 2 (two) times daily.    EXAM:  BP 136/80 mmHg  Temp(Src) 97.2 F (36.2 C) (Oral)  Ht 4\' 11"  (1.499 m)  Wt 122 lb 8 oz (55.566 kg)  BMI 24.73 kg/m2  Body mass index is 24.73 kg/(m^2).  Physical Exam: Vital signs reviewed LNL:GXQJ is a well-developed well-nourished alert cooperative   who appears stated age in no acute distress.  HEENT: normocephalic atraumatic , Eyes: PERRL EOM's full, conjunctiva clear, Nares: paten,t no deformity discharge or tenderness., Ears: no deformity EAC's clear TMs with normal landmarks. Mouth: clear OP, no lesions, edema.  Moist mucous membranes. Dentition in adequate repair. NECK: supple without masses, thyromegaly or bruits. CHEST/PULM:  Clear to auscultation and percussion breath sounds equal no wheeze , rales or rhonchi. No chest wall deformities or tenderness. CV: PMI is nondisplaced, S1 S2 no gallops, murmurs, rubs. Peripheral pulses are full without delay.No JVD .  Breast: normal by inspection . No dimpling, discharge, masses, tenderness or discharge . ABDOMEN: Bowel sounds normal nontender  No guard or rebound, no hepato splenomegal no CVA tenderness.  No hernia. Extremtities:  No clubbing cyanosis or edema, no acute joint swelling or redness no focal atrophy NEURO:  Oriented x3, cranial nerves 3-12 appear to be intact, no obvious focal weakness,gait within normal limits no abnormal reflexes or asymmetrical SKIN: No acute rashes normal turgor, color, no bruising or  petechiae. PSYCH: Oriented, good eye contact, no obvious depression anxiety, cognition and judgment appear normal. LN: no cervical axillary inguinal adenopathy No noted deficits in memory, attention, and speech. External GU normal no adenopathy lesions   BP Readings from Last 3 Encounters:  10/15/14 136/80  08/21/14 130/90  06/04/14 178/80    Lab Results  Component Value Date   WBC 5.4 10/09/2014   HGB 13.2 10/09/2014   HCT 39.1 10/09/2014   PLT 224.0 10/09/2014   GLUCOSE 91 10/09/2014   CHOL 183 10/09/2014   TRIG 99.0 10/09/2014   HDL  72.40 10/09/2014   LDLDIRECT 120.7 05/02/2010   LDLCALC 91 10/09/2014   ALT 13 10/09/2014   AST 19 10/09/2014   NA 136 10/09/2014   K 4.4 10/09/2014   CL 102 10/09/2014   CREATININE 0.67 10/09/2014   BUN 13 10/09/2014   CO2 27 10/09/2014   TSH 2.81 10/09/2014   INR 1.0 07/23/2008   HGBA1C  07/23/2008    5.7 (NOTE)   The ADA recommends the following therapeutic goal for glycemic   control related to Hgb A1C measurement:   Goal of Therapy:   < 7.0% Hgb A1C   Reference: American Diabetes Association: Clinical Practice   Recommendations 2008, Diabetes Care,  2008, 31:(Suppl 1).    ASSESSMENT AND PLAN:  Discussed the following assessment and plan:  Medicare annual wellness visit, subsequent - doing much better  ls no meds  continue  allergic to flu vaccine    Patient Care Team: Burnis Medin, MD as PCP - [place] Gully Dr. Jossie Ng, MD as Consulting Physician (Dermatology) Aram Beecham   Patient Instructions   Continue lifestyle intervention healthy eating and exercise . Yearly flu vaccine   Healthy lifestyle includes : At least 150 minutes of exercise weeks  , weight at healthy levels, which is usually   BMI 19-25. Avoid trans fats and processed foods;  Increase fresh fruits and veges to 5 servings per day. And avoid sweet beverages including tea and juice. Mediterranean diet with olive oil and nuts  have been noted to be heart and brain healthy . Avoid tobacco products . Limit  alcohol to  7 per week for women and 14 servings for men.  Get adequate sleep . Wear seat belts . Don't text and drive .      Why follow it? Research shows. . Those who follow the Mediterranean diet have a reduced risk of heart disease  . The diet is associated with a reduced incidence of Parkinson's and Alzheimer's diseases . People following the diet may have longer life expectancies and lower rates of chronic diseases  . The Dietary Guidelines for Americans recommends the Mediterranean diet as an eating plan to promote health and prevent disease  What Is the Mediterranean Diet?  . Healthy eating plan based on typical foods and recipes of Mediterranean-style cooking . The diet is primarily a plant based diet; these foods should make up a majority of meals   Starches - Plant based foods should make up a majority of meals - They are an important sources of vitamins, minerals, energy, antioxidants, and fiber - Choose whole grains, foods high in fiber and minimally processed items  - Typical grain sources include wheat, oats, barley, corn, brown rice, bulgar, farro, millet, polenta, couscous  - Various types of beans include chickpeas, lentils, fava beans, black beans, white beans   Fruits  Veggies - Large quantities of antioxidant rich fruits & veggies; 6 or more servings  - Vegetables can be eaten raw or lightly drizzled with oil and cooked  - Vegetables common to the traditional Mediterranean Diet include: artichokes, arugula, beets, broccoli, brussel sprouts, cabbage, carrots, celery, collard greens, cucumbers, eggplant, kale, leeks, lemons, lettuce, mushrooms, okra, onions, peas, peppers, potatoes, pumpkin, radishes, rutabaga, shallots, spinach, sweet potatoes, turnips, zucchini - Fruits common to the Mediterranean Diet include: apples, apricots, avocados, cherries, clementines, dates, figs, grapefruits,  grapes, melons, nectarines, oranges, peaches, pears, pomegranates, strawberries, tangerines  Fats - Replace butter and margarine with healthy oils, such as olive oil,  canola oil, and tahini  - Limit nuts to no more than a handful a day  - Nuts include walnuts, almonds, pecans, pistachios, pine nuts  - Limit or avoid candied, honey roasted or heavily salted nuts - Olives are central to the Mediterranean diet - can be eaten whole or used in a variety of dishes   Meats Protein - Limiting red meat: no more than a few times a month - When eating red meat: choose lean cuts and keep the portion to the size of deck of cards - Eggs: approx. 0 to 4 times a week  - Fish and lean poultry: at least 2 a week  - Healthy protein sources include, chicken, [place], lean beef, lamb - Increase intake of seafood such as tuna, salmon, trout, mackerel, shrimp, scallops - Avoid or limit high fat processed meats such as sausage and bacon  Dairy - Include moderate amounts of low fat dairy products  - Focus on healthy dairy such as fat free yogurt, skim milk, low or reduced fat cheese - Limit dairy products higher in fat such as whole or 2% milk, cheese, ice cream  Alcohol - Moderate amounts of red wine is ok  - No more than 5 oz daily for women (all ages) and men older than age 23  - No more than 10 oz of wine daily for men younger than 38  Other - Limit sweets and other desserts  - Use herbs and spices instead of salt to flavor foods  - Herbs and spices common to the traditional Mediterranean Diet include: basil, bay leaves, chives, cloves, cumin, fennel, garlic, lavender, marjoram, mint, oregano, parsley, pepper, rosemary, sage, savory, sumac, tarragon, thyme   It's not just a diet, it's a lifestyle:  . The Mediterranean diet includes lifestyle factors typical of those in the region  . Foods, drinks and meals are best eaten with others and savored . Daily physical activity is important for overall good  health . This could be strenuous exercise like running and aerobics . This could also be more leisurely activities such as walking, housework, yard-work, or taking the stairs . Moderation is the key; a balanced and healthy diet accommodates most foods and drinks . Consider portion sizes and frequency of consumption of certain foods   Meal Ideas & Options:  . Breakfast:  o Whole wheat toast or whole wheat English muffins with peanut butter & hard boiled egg o Steel cut oats topped with apples & cinnamon and skim milk  o Fresh fruit: banana, strawberries, melon, berries, peaches  o Smoothies: strawberries, bananas, greek yogurt, peanut butter o Low fat greek yogurt with blueberries and granola  o Egg white omelet with spinach and mushrooms o Breakfast couscous: whole wheat couscous, apricots, skim milk, cranberries  . Sandwiches:  o Hummus and grilled vegetables (peppers, zucchini, squash) on whole wheat bread   o Grilled chicken on whole wheat pita with lettuce, tomatoes, cucumbers or tzatziki  o Tuna salad on whole wheat bread: tuna salad made with greek yogurt, olives, red peppers, capers, green onions o Garlic rosemary lamb pita: lamb sauted with garlic, rosemary, salt & pepper; add lettuce, cucumber, greek yogurt to pita - flavor with lemon juice and black pepper  . Seafood:  o Mediterranean grilled salmon, seasoned with garlic, basil, parsley, lemon juice and black pepper o Shrimp, lemon, and spinach whole-grain pasta salad made with low fat greek yogurt  o Seared scallops with lemon orzo  o Seared tuna steaks seasoned salt,  pepper, coriander topped with tomato mixture of olives, tomatoes, olive oil, minced garlic, parsley, green onions and cappers  . Meats:  o Herbed greek chicken salad with kalamata olives, cucumber, feta  o Red bell peppers stuffed with spinach, bulgur, lean ground beef (or lentils) & topped with feta   o Kebabs: skewers of chicken, tomatoes, onions, zucchini,  squash  o [place] burgers: made with red onions, mint, dill, lemon juice, feta cheese topped with roasted red peppers . Vegetarian o Cucumber salad: cucumbers, artichoke hearts, celery, red onion, feta cheese, tossed in olive oil & lemon juice  o Hummus and whole grain pita points with a greek salad (lettuce, tomato, feta, olives, cucumbers, red onion) o Lentil soup with celery, carrots made with vegetable broth, garlic, salt and pepper  o Tabouli salad: parsley, bulgur, mint, scallions, cucumbers, tomato, radishes, lemon juice, olive oil, salt and pepper.         Standley Brooking. Dontasia Miranda M.D.

## 2014-10-15 NOTE — Patient Instructions (Addendum)
Continue lifestyle intervention healthy eating and exercise . Yearly flu vaccine   Healthy lifestyle includes : At least 150 minutes of exercise weeks  , weight at healthy levels, which is usually   BMI 19-25. Avoid trans fats and processed foods;  Increase fresh fruits and veges to 5 servings per day. And avoid sweet beverages including tea and juice. Mediterranean diet with olive oil and nuts have been noted to be heart and brain healthy . Avoid tobacco products . Limit  alcohol to  7 per week for women and 14 servings for men.  Get adequate sleep . Wear seat belts . Don't text and drive .      Why follow it? Research shows. . Those who follow the Mediterranean diet have a reduced risk of heart disease  . The diet is associated with a reduced incidence of Parkinson's and Alzheimer's diseases . People following the diet may have longer life expectancies and lower rates of chronic diseases  . The Dietary Guidelines for Americans recommends the Mediterranean diet as an eating plan to promote health and prevent disease  What Is the Mediterranean Diet?  . Healthy eating plan based on typical foods and recipes of Mediterranean-style cooking . The diet is primarily a plant based diet; these foods should make up a majority of meals   Starches - Plant based foods should make up a majority of meals - They are an important sources of vitamins, minerals, energy, antioxidants, and fiber - Choose whole grains, foods high in fiber and minimally processed items  - Typical grain sources include wheat, oats, barley, corn, brown rice, bulgar, farro, millet, polenta, couscous  - Various types of beans include chickpeas, lentils, fava beans, black beans, white beans   Fruits  Veggies - Large quantities of antioxidant rich fruits & veggies; 6 or more servings  - Vegetables can be eaten raw or lightly drizzled with oil and cooked  - Vegetables common to the traditional Mediterranean Diet include:  artichokes, arugula, beets, broccoli, brussel sprouts, cabbage, carrots, celery, collard greens, cucumbers, eggplant, kale, leeks, lemons, lettuce, mushrooms, okra, onions, peas, peppers, potatoes, pumpkin, radishes, rutabaga, shallots, spinach, sweet potatoes, turnips, zucchini - Fruits common to the Mediterranean Diet include: apples, apricots, avocados, cherries, clementines, dates, figs, grapefruits, grapes, melons, nectarines, oranges, peaches, pears, pomegranates, strawberries, tangerines  Fats - Replace butter and margarine with healthy oils, such as olive oil, canola oil, and tahini  - Limit nuts to no more than a handful a day  - Nuts include walnuts, almonds, pecans, pistachios, pine nuts  - Limit or avoid candied, honey roasted or heavily salted nuts - Olives are central to the [employer] - can be eaten whole or used in a variety of dishes   Meats Protein - Limiting red meat: no more than a few times a month - When eating red meat: choose lean cuts and keep the portion to the size of deck of cards - Eggs: approx. 0 to 4 times a week  - Fish and lean poultry: at least 2 a week  - Healthy protein sources include, chicken, [place], lean beef, lamb - Increase intake of seafood such as tuna, salmon, trout, mackerel, shrimp, scallops - Avoid or limit high fat processed meats such as sausage and bacon  Dairy - Include moderate amounts of low fat dairy products  - Focus on healthy dairy such as fat free yogurt, skim milk, low or reduced fat cheese - Limit dairy products higher in fat such as whole  or 2% milk, cheese, ice cream  Alcohol - Moderate amounts of red wine is ok  - No more than 5 oz daily for women (all ages) and men older than age 18  - No more than 10 oz of wine daily for men younger than 8  Other - Limit sweets and other desserts  - Use herbs and spices instead of salt to flavor foods  - Herbs and spices common to the traditional Mediterranean Diet include: basil, bay  leaves, chives, cloves, cumin, fennel, garlic, lavender, marjoram, mint, oregano, parsley, pepper, rosemary, sage, savory, sumac, tarragon, thyme   It's not just a diet, it's a lifestyle:  . The Mediterranean diet includes lifestyle factors typical of those in the region  . Foods, drinks and meals are best eaten with others and savored . Daily physical activity is important for overall good health . This could be strenuous exercise like running and aerobics . This could also be more leisurely activities such as walking, housework, yard-work, or taking the stairs . Moderation is the key; a balanced and healthy diet accommodates most foods and drinks . Consider portion sizes and frequency of consumption of certain foods   Meal Ideas & Options:  . Breakfast:  o Whole wheat toast or whole wheat English muffins with peanut butter & hard boiled egg o Steel cut oats topped with apples & cinnamon and skim milk  o Fresh fruit: banana, strawberries, melon, berries, peaches  o Smoothies: strawberries, bananas, greek yogurt, peanut butter o Low fat greek yogurt with blueberries and granola  o Egg white omelet with spinach and mushrooms o Breakfast couscous: whole wheat couscous, apricots, skim milk, cranberries  . Sandwiches:  o Hummus and grilled vegetables (peppers, zucchini, squash) on whole wheat bread   o Grilled chicken on whole wheat pita with lettuce, tomatoes, cucumbers or tzatziki  o Tuna salad on whole wheat bread: tuna salad made with greek yogurt, olives, red peppers, capers, green onions o Garlic rosemary lamb pita: lamb sauted with garlic, rosemary, salt & pepper; add lettuce, cucumber, greek yogurt to pita - flavor with lemon juice and black pepper  . Seafood:  o Mediterranean grilled salmon, seasoned with garlic, basil, parsley, lemon juice and black pepper o Shrimp, lemon, and spinach whole-grain pasta salad made with low fat greek yogurt  o Seared scallops with lemon orzo   o Seared tuna steaks seasoned salt, pepper, coriander topped with tomato mixture of olives, tomatoes, olive oil, minced garlic, parsley, green onions and cappers  . Meats:  o Herbed greek chicken salad with kalamata olives, cucumber, feta  o Red bell peppers stuffed with spinach, bulgur, lean ground beef (or lentils) & topped with feta   o Kebabs: skewers of chicken, tomatoes, onions, zucchini, squash  o [place] burgers: made with red onions, mint, dill, lemon juice, feta cheese topped with roasted red peppers . Vegetarian o Cucumber salad: cucumbers, artichoke hearts, celery, red onion, feta cheese, tossed in olive oil & lemon juice  o Hummus and whole grain pita points with a greek salad (lettuce, tomato, feta, olives, cucumbers, red onion) o Lentil soup with celery, carrots made with vegetable broth, garlic, salt and pepper  o Tabouli salad: parsley, bulgur, mint, scallions, cucumbers, tomato, radishes, lemon juice, olive oil, salt and pepper.

## 2014-11-15 DIAGNOSIS — H04123 Dry eye syndrome of bilateral lacrimal glands: Secondary | ICD-10-CM | POA: Diagnosis not present

## 2014-11-15 DIAGNOSIS — Z961 Presence of intraocular lens: Secondary | ICD-10-CM | POA: Diagnosis not present

## 2014-11-20 ENCOUNTER — Ambulatory Visit (INDEPENDENT_AMBULATORY_CARE_PROVIDER_SITE_OTHER): Payer: Medicare Other | Admitting: Family Medicine

## 2014-11-20 ENCOUNTER — Encounter: Payer: Self-pay | Admitting: Family Medicine

## 2014-11-20 VITALS — BP 140/78 | HR 84 | Temp 98.6°F | Ht 59.0 in | Wt 123.9 lb

## 2014-11-20 DIAGNOSIS — R300 Dysuria: Secondary | ICD-10-CM

## 2014-11-20 LAB — POCT URINALYSIS DIPSTICK
Bilirubin, UA: NEGATIVE
GLUCOSE UA: NEGATIVE
Ketones, UA: NEGATIVE
Nitrite, UA: NEGATIVE
Protein, UA: NEGATIVE
UROBILINOGEN UA: 0.2
pH, UA: 6

## 2014-11-20 MED ORDER — NITROFURANTOIN MONOHYD MACRO 100 MG PO CAPS: 100.0000 mg | ORAL_CAPSULE | Freq: Two times a day (BID) | ORAL | Status: DC

## 2014-11-20 NOTE — Progress Notes (Signed)
HPI:  Dysuria: -started 2 days ago -symptoms: a little urinary frequency - going to the beach in a few days so wanted to make sure no UTI -denies: fevers, NVD, vaginal pruritis or discharge, flank pain, hematuria  ROS: See pertinent positives and negatives per HPI.  Past Medical History  Diagnosis Date  . Osteoarthritis   . Osteopenia   . Hypertension   . Allergic rhinitis   . Ocular migraine     Consult - [facility]  hospitalized  . Sarcoidosis   . Retinal tear     OS  . Scoliosis   . Chronic back pain   . Pulmonary nodule     [facility]  . Diverticulosis   . Cataract   . Agatston coronary artery calcium score less than 100 09/17/2012    4.4  lad 25%ile for age     Past Surgical History  Procedure Laterality Date  . Abdominal hysterectomy    . Bilateral salpingoophorectomy    . Back surgery      x 2  . Cataract extraction  12/11    Family History  Problem Relation Age of Onset  . Aortic aneurysm    . Stroke    . Colon cancer Neg Hx   . Heart disease Mother   . Lung cancer Father   . Arthritis Father   . Hyperlipidemia Sister   . Arthritis Sister   . Diabetes Brother   . Stroke Brother   . Hyperlipidemia Brother     History   Social History  . Marital Status: Widowed    Spouse Name: N/A  . Number of Children: 6  . Years of Education: N/A   Occupational History  . retired    Social History Main Topics  . Smoking status: Never Smoker   . Smokeless tobacco: Never Used  . Alcohol Use: Yes     Comment: Wine, red or white every night  . Drug Use: No  . Sexual Activity: Not on file   Other Topics Concern  . None   Social History Narrative   CONSULTS   Dr Leonie Man - neuro in ED  Now Dr Brett Fairy      Retired,  Lives  daughters garage  apt   Widowed, husband passed away in 12/08/2007  Vegetarian mostly   Daily caffeine use   Regular exercise - YES   Plans to travel.                  Current outpatient prescriptions:  .   Cholecalciferol (VITAMIN D PO), Take 500 Units by mouth daily. , Disp: , Rfl:  .  Magnesium 250 MG TABS, Take by mouth at bedtime. , Disp: , Rfl:  .  Probiotic Product (PROBIOTIC PO), Take by mouth., Disp: , Rfl:   EXAM:  Filed Vitals:   11/20/14 1407  BP: 140/78  Pulse: 84  Temp: 98.6 F (37 C)    Body mass index is 25.01 kg/(m^2).  GENERAL: vitals reviewed and listed above, alert, oriented, appears well hydrated and in no acute distress  HEENT: atraumatic, conjunttiva clear, no obvious abnormalities on inspection of external nose and ears  NECK: no obvious masses on inspection  LUNGS: clear to auscultation bilaterally, no wheezes, rales or rhonchi, good air movement  CV: HRRR, no peripheral edema  ABD: BS+, soft, NTTP, no rebound or guarding  MS: moves all extremities without noticeable abnormality  PSYCH: pleasant and cooperative, no obvious depression or anxiety  ASSESSMENT  AND PLAN:  Discussed the following assessment and plan:  Dysuria  -udip and cx, abx if needed - discussed options and risks and would do macrobid if empiric -supportive and preventive strategies discussed -Patient advised to return or notify a doctor immediately if symptoms worsen or persist or new concerns arise.  There are no Patient Instructions on file for this visit.   Colin Benton R.

## 2014-11-20 NOTE — Addendum Note (Signed)
Addended by: Agnes Lawrence on: 11/20/2014 02:25 PM   Modules accepted: Orders

## 2014-11-20 NOTE — Progress Notes (Signed)
Pre visit review using our clinic review tool, if applicable. No additional management support is needed unless otherwise documented below in the visit note. 

## 2014-11-20 NOTE — Addendum Note (Signed)
Addended by: Lucretia Kern on: 11/20/2014 03:40 PM   Modules accepted: Orders

## 2014-11-23 LAB — URINE CULTURE

## 2014-12-17 DIAGNOSIS — Z0000 Encounter for general adult medical examination without abnormal findings: Secondary | ICD-10-CM | POA: Diagnosis not present

## 2015-01-24 DIAGNOSIS — M5416 Radiculopathy, lumbar region: Secondary | ICD-10-CM | POA: Diagnosis not present

## 2015-01-24 DIAGNOSIS — M5137 Other intervertebral disc degeneration, lumbosacral region: Secondary | ICD-10-CM | POA: Diagnosis not present

## 2015-01-25 DIAGNOSIS — M4186 Other forms of scoliosis, lumbar region: Secondary | ICD-10-CM | POA: Diagnosis not present

## 2015-01-25 DIAGNOSIS — M5137 Other intervertebral disc degeneration, lumbosacral region: Secondary | ICD-10-CM | POA: Diagnosis not present

## 2015-01-25 DIAGNOSIS — M47816 Spondylosis without myelopathy or radiculopathy, lumbar region: Secondary | ICD-10-CM | POA: Diagnosis not present

## 2015-01-25 DIAGNOSIS — M5136 Other intervertebral disc degeneration, lumbar region: Secondary | ICD-10-CM | POA: Diagnosis not present

## 2015-01-31 DIAGNOSIS — M5137 Other intervertebral disc degeneration, lumbosacral region: Secondary | ICD-10-CM | POA: Diagnosis not present

## 2015-01-31 DIAGNOSIS — M4120 Other idiopathic scoliosis, site unspecified: Secondary | ICD-10-CM | POA: Diagnosis not present

## 2015-02-25 ENCOUNTER — Other Ambulatory Visit: Payer: Self-pay | Admitting: Dermatology

## 2015-02-25 DIAGNOSIS — L309 Dermatitis, unspecified: Secondary | ICD-10-CM | POA: Diagnosis not present

## 2015-02-25 DIAGNOSIS — L308 Other specified dermatitis: Secondary | ICD-10-CM | POA: Diagnosis not present

## 2015-03-04 ENCOUNTER — Other Ambulatory Visit: Payer: Self-pay

## 2015-03-05 DIAGNOSIS — L080 Pyoderma: Secondary | ICD-10-CM | POA: Diagnosis not present

## 2015-04-08 DIAGNOSIS — L821 Other seborrheic keratosis: Secondary | ICD-10-CM | POA: Diagnosis not present

## 2015-04-08 DIAGNOSIS — L309 Dermatitis, unspecified: Secondary | ICD-10-CM | POA: Diagnosis not present

## 2015-04-08 DIAGNOSIS — D225 Melanocytic nevi of trunk: Secondary | ICD-10-CM | POA: Diagnosis not present

## 2015-04-08 DIAGNOSIS — L814 Other melanin hyperpigmentation: Secondary | ICD-10-CM | POA: Diagnosis not present

## 2015-04-18 DIAGNOSIS — R21 Rash and other nonspecific skin eruption: Secondary | ICD-10-CM | POA: Diagnosis not present

## 2015-04-18 DIAGNOSIS — Z981 Arthrodesis status: Secondary | ICD-10-CM | POA: Diagnosis not present

## 2015-04-18 DIAGNOSIS — M419 Scoliosis, unspecified: Secondary | ICD-10-CM | POA: Diagnosis not present

## 2015-04-18 DIAGNOSIS — M4185 Other forms of scoliosis, thoracolumbar region: Secondary | ICD-10-CM | POA: Diagnosis not present

## 2015-04-18 DIAGNOSIS — M5136 Other intervertebral disc degeneration, lumbar region: Secondary | ICD-10-CM | POA: Diagnosis not present

## 2015-04-18 DIAGNOSIS — M4316 Spondylolisthesis, lumbar region: Secondary | ICD-10-CM | POA: Diagnosis not present

## 2015-04-18 DIAGNOSIS — M545 Low back pain: Secondary | ICD-10-CM | POA: Diagnosis not present

## 2015-04-19 DIAGNOSIS — L80 Vitiligo: Secondary | ICD-10-CM | POA: Diagnosis not present

## 2015-04-19 DIAGNOSIS — M545 Low back pain: Secondary | ICD-10-CM | POA: Diagnosis not present

## 2015-04-24 ENCOUNTER — Encounter: Payer: Self-pay | Admitting: Gastroenterology

## 2015-04-25 ENCOUNTER — Telehealth: Payer: Self-pay | Admitting: Internal Medicine

## 2015-04-25 NOTE — Telephone Encounter (Signed)
Pt ears are clogged and requesting an appt next week with dr Regis Bill to have them clean. Pt has tried otc med in past. Can I use sda slot?

## 2015-04-26 NOTE — Telephone Encounter (Signed)
Pt scheduled at 1:45 on Monday.

## 2015-04-29 ENCOUNTER — Ambulatory Visit (INDEPENDENT_AMBULATORY_CARE_PROVIDER_SITE_OTHER): Payer: Medicare Other | Admitting: Internal Medicine

## 2015-04-29 ENCOUNTER — Encounter: Payer: Self-pay | Admitting: Internal Medicine

## 2015-04-29 VITALS — BP 142/82 | Temp 99.2°F | Ht 59.0 in | Wt 126.0 lb

## 2015-04-29 DIAGNOSIS — L80 Vitiligo: Secondary | ICD-10-CM | POA: Diagnosis not present

## 2015-04-29 DIAGNOSIS — H6123 Impacted cerumen, bilateral: Secondary | ICD-10-CM

## 2015-04-29 DIAGNOSIS — R030 Elevated blood-pressure reading, without diagnosis of hypertension: Secondary | ICD-10-CM

## 2015-04-29 DIAGNOSIS — IMO0001 Reserved for inherently not codable concepts without codable children: Secondary | ICD-10-CM

## 2015-04-29 NOTE — Patient Instructions (Signed)
Can try     Ear wax softener .     Debrox or per pharmacist perhaps at night   For 1 week per month .  Also  liquid colace drops have been used . But dont know if available at this time.   Keep  Monitoring  bp readings.   To ensure control.

## 2015-04-29 NOTE — Progress Notes (Signed)
Pre visit review using our clinic review tool, if applicable. No additional management support is needed unless otherwise documented below in the visit note.  Chief Complaint  Patient presents with  . Cerumen Impaction    HPI: Patient Kristin Jimenez  comes in today for SDA for  problem evaluation.  Dec hearing again   Seems to need wax irrigated yearsly no pain   Back to [facility] clinic  For back.  bp upper normal  New   onset vitiligo  Ant checst on a steroid cream  Sudden onset after sun exposures and stress. Feeling healthy overall.  Had to move suddenly  ROS: See pertinent positives and negatives per HPI.  Past Medical History  Diagnosis Date  . Osteoarthritis   . Osteopenia   . Hypertension   . Allergic rhinitis   . Ocular migraine     Consult - [facility]  hospitalized  . Sarcoidosis   . Retinal tear     OS  . Scoliosis   . Chronic back pain   . Pulmonary nodule     [facility]  . Diverticulosis   . Cataract   . Agatston coronary artery calcium score less than 100 09/17/2012    4.4  lad 25%ile for age     Family History  Problem Relation Age of Onset  . Aortic aneurysm    . Stroke    . Colon cancer Neg Hx   . Heart disease Mother   . Lung cancer Father   . Arthritis Father   . Hyperlipidemia Sister   . Arthritis Sister   . Diabetes Brother   . Stroke Brother   . Hyperlipidemia Brother     Social History   Social History  . Marital Status: Widowed    Spouse Name: N/A  . Number of Children: 6  . Years of Education: N/A   Occupational History  . retired    Social History Main Topics  . Smoking status: Never Smoker   . Smokeless tobacco: Never Used  . Alcohol Use: Yes     Comment: Wine, red or white every night  . Drug Use: No  . Sexual Activity: Not Asked   Other Topics Concern  . None   Social History Narrative   CONSULTS   Dr Leonie Man - neuro in ED  Now Dr Brett Fairy      Retired,  Lives  daughters garage  apt   Widowed,  husband passed away in 12-12-07  Vegetarian mostly   Daily caffeine use   Regular exercise - YES   Plans to travel.                 Outpatient Prescriptions Prior to Visit  Medication Sig Dispense Refill  . Cholecalciferol (VITAMIN D PO) Take 500 Units by mouth daily.     . Magnesium 250 MG TABS Take by mouth at bedtime.     . Probiotic Product (PROBIOTIC PO) Take by mouth.    . nitrofurantoin, macrocrystal-monohydrate, (MACROBID) 100 MG capsule Take 1 capsule (100 mg total) by mouth 2 (two) times daily. 14 capsule 0   No facility-administered medications prior to visit.     EXAM:  BP 142/82 mmHg  Temp(Src) 99.2 F (37.3 C) (Oral)  Ht 4\' 11"  (1.499 m)  Wt 126 lb (57.153 kg)  BMI 25.44 kg/m2  Body mass index is 25.44 kg/(m^2).  GENERAL: vitals reviewed and listed above, alert, oriented, appears well hydrated and in no acute distress HEENT:  atraumatic, conjunctiva  clear, no obvious abnormalities on inspection of external nose and ears tmx clear after irrigation wax   OP : no lesion edema or exudate  NECK: no obvious masses on inspection palpation  No bruit  CV: HRRR, no clubbing cyanosis or  peripheral edema nl cap refill  MS: moves all extremities without noticeable focal  Abnormality skin pink depigmented area on neck anterior  PSYCH: pleasant and cooperative, no obvious depression or anxiety  ASSESSMENT AND PLAN:  Discussed the following assessment and plan:  Cerumen impaction, bilateral  Elevated BP - hx of same  comes down with repeat  dsic goals   Vitiligo - new dx  eval derm and [facility]  -Patient advised to return or notify health care team  if symptoms worsen ,persist or new concerns arise.  Patient Instructions  Can try     Ear wax softener .     Debrox or per pharmacist perhaps at night   For 1 week per month .  Also  liquid colace drops have been used . But dont know if available at this time.   Keep  Monitoring  bp readings.   To ensure control.     Standley Brooking. Spiro Ausborn M.D.

## 2015-05-02 DIAGNOSIS — Z961 Presence of intraocular lens: Secondary | ICD-10-CM | POA: Diagnosis not present

## 2015-07-18 ENCOUNTER — Encounter: Payer: Self-pay | Admitting: Internal Medicine

## 2015-07-18 ENCOUNTER — Ambulatory Visit (INDEPENDENT_AMBULATORY_CARE_PROVIDER_SITE_OTHER): Payer: Medicare Other | Admitting: Internal Medicine

## 2015-07-18 ENCOUNTER — Ambulatory Visit (INDEPENDENT_AMBULATORY_CARE_PROVIDER_SITE_OTHER)
Admission: RE | Admit: 2015-07-18 | Discharge: 2015-07-18 | Disposition: A | Discharge status: Home / Self Care | Payer: Medicare Other | Source: Ambulatory Visit | Attending: Internal Medicine | Admitting: Internal Medicine

## 2015-07-18 VITALS — BP 146/96 | Temp 98.6°F | Wt 123.9 lb

## 2015-07-18 DIAGNOSIS — R0989 Other specified symptoms and signs involving the circulatory and respiratory systems: Secondary | ICD-10-CM | POA: Insufficient documentation

## 2015-07-18 DIAGNOSIS — R05 Cough: Secondary | ICD-10-CM

## 2015-07-18 DIAGNOSIS — R198 Other specified symptoms and signs involving the digestive system and abdomen: Secondary | ICD-10-CM | POA: Diagnosis not present

## 2015-07-18 DIAGNOSIS — R6889 Other general symptoms and signs: Secondary | ICD-10-CM

## 2015-07-18 DIAGNOSIS — R053 Chronic cough: Secondary | ICD-10-CM

## 2015-07-18 NOTE — Patient Instructions (Addendum)
Lots of causes . For cough  exam is reassuring today .  Poss allergic  Or even esophageal reflux possible Get chest x ray  Stop the cough drops and can try sugar  Free candy.   And gum instead.   Can try OTC  antihistamine short time. claritin and  Allegra .  Sometimes  Acid blockers such as prilosec for 6 weeks can help .but can  Do antireflux measures sucha aas small meals  . No eating last at night .    Cough, Adult Coughing is a reflex that clears your throat and your airways. Coughing helps to heal and protect your lungs. It is normal to cough occasionally, but a cough that happens with other symptoms or lasts a long time may be a sign of a condition that needs treatment. A cough may last only 2-3 weeks (acute), or it may last longer than 8 weeks (chronic). CAUSES Coughing is commonly caused by:  Breathing in substances that irritate your lungs.  A viral or bacterial respiratory infection.  Allergies.  Asthma.  Postnasal drip.  Smoking.  Acid backing up from the stomach into the esophagus (gastroesophageal reflux).  Certain medicines.  Chronic lung problems, including COPD (or rarely, lung cancer).  Other medical conditions such as heart failure. HOME CARE INSTRUCTIONS  Pay attention to any changes in your symptoms. Take these actions to help with your discomfort:  Take medicines only as told by your health care provider.  If you were prescribed an antibiotic medicine, take it as told by your health care provider. Do not stop taking the antibiotic even if you start to feel better.  Talk with your health care provider before you take a cough suppressant medicine.  Drink enough fluid to keep your urine clear or pale yellow.  If the air is dry, use a cold steam vaporizer or humidifier in your bedroom or your home to help loosen secretions.  Avoid anything that causes you to cough at work or at home.  If your cough is worse at night, try sleeping in a semi-upright  position.  Avoid cigarette smoke. If you smoke, quit smoking. If you need help quitting, ask your health care provider.  Avoid caffeine.  Avoid alcohol.  Rest as needed. SEEK MEDICAL CARE IF:   You have new symptoms.  You cough up pus.  Your cough does not get better after 2-3 weeks, or your cough gets worse.  You cannot control your cough with suppressant medicines and you are losing sleep.  You develop pain that is getting worse or pain that is not controlled with pain medicines.  You have a fever.  You have unexplained weight loss.  You have night sweats. SEEK IMMEDIATE MEDICAL CARE IF:  You cough up blood.  You have difficulty breathing.  Your heartbeat is very fast.   This information is not intended to replace advice given to you by your health care provider. Make sure you discuss any questions you have with your health care provider.   Document Released: 02/20/2011 Document Revised: 05/15/2015 Document Reviewed: 10/31/2014 Elsevier Interactive Patient Education [employer].

## 2015-07-18 NOTE — Progress Notes (Signed)
Pre visit review using our clinic review tool, if applicable. No additional management support is needed unless otherwise documented below in the visit note.  Chief Complaint  Patient presents with  . Cough    HPI: Patient Kristin Jimenez  comes in today for SDA for  new problem evaluation.  cough since August  16 or thereabout s Wants to make sure  Not  Important not that sick but husband fatal illnes began with a cough  Has phlegm is throat all the time  Throat clearing for  A long time  Now has a little  Hacking cough august .  Not that worse at noght but in am  And better as day goes on  Has  allergies  Fall never tested  No meds  recnet move to apartment . Older  ? If mold . Thinks these sx relate to change in residence  Was a watre leak in the wall in past  Worse when there  .   Sneezing  In fits. In am  Some at night .  Not bad enough to take medication No hx asthma tobacco ROS: See pertinent positives and negatives per HPI. Using cough drops  No hemoptysis  Sob weheezing  Past Medical History  Diagnosis Date  . Osteoarthritis   . Osteopenia   . Hypertension   . Allergic rhinitis   . Ocular migraine     Consult - [facility]  hospitalized  . Sarcoidosis ([place])   . Retinal tear     OS  . Scoliosis   . Chronic back pain   . Pulmonary nodule     [facility]  . Diverticulosis   . Cataract   . Agatston coronary artery calcium score less than 100 09/17/2012    4.4  lad 25%ile for age     Family History  Problem Relation Age of Onset  . Aortic aneurysm    . Stroke    . Colon cancer Neg Hx   . Heart disease Mother   . Lung cancer Father   . Arthritis Father   . Hyperlipidemia Sister   . Arthritis Sister   . Diabetes Brother   . Stroke Brother   . Hyperlipidemia Brother     Social History   Social History  . Marital Status: Widowed    Spouse Name: N/A  . Number of Children: 6  . Years of Education: N/A   Occupational History  . retired     Social History Main Topics  . Smoking status: Never Smoker   . Smokeless tobacco: Never Used  . Alcohol Use: Yes     Comment: Wine, red or white every night  . Drug Use: No  . Sexual Activity: Not Asked   Other Topics Concern  . None   Social History Narrative   CONSULTS   Dr Leonie Man - neuro in ED  Now Dr Brett Fairy      Retired,  Lives  daughters garage  apt   Widowed, husband passed away in 16-Nov-2007  Vegetarian mostly   Daily caffeine use   Regular exercise - YES   Plans to travel.                 Outpatient Prescriptions Prior to Visit  Medication Sig Dispense Refill  . Cholecalciferol (VITAMIN D PO) Take 2,000 Units by mouth daily.     . Magnesium 250 MG TABS Take by mouth at bedtime.     . Probiotic Product (PROBIOTIC  PO) Take by mouth.     No facility-administered medications prior to visit.     EXAM:  BP 146/96 mmHg  Temp(Src) 98.6 F (37 C) (Oral)  Wt 123 lb 14.4 oz (56.201 kg)  Body mass index is 25.01 kg/(m^2).  GENERAL: vitals reviewed and listed above, alert, oriented, appears well hydrated and in no acute distress min hoarseness and throat clearing  Looks well  HEENT: atraumatic, conjunctiva  clear, no obvious abnormalities on inspection of external nose and earstm nl op face nontenders OP : no lesion edema or exudate  NECK: no obvious masses on inspection palpation  LUNGS: clear to auscultation bilaterally, no wheezes, rales or rhonchi, good air movement CV: HRRR, no clubbing cyanosis or  peripheral edema nl cap refill  Abdomen:  Sof,t normal bowel sounds without hepatosplenomegaly, no guarding rebound or masses no CVA tenderness MS: moves all extremities without noticeable focal  abnormality PSYCH: pleasant and cooperative, no obvious depression or anxiety Lab Results  Component Value Date   WBC 5.4 10/09/2014   HGB 13.2 10/09/2014   HCT 39.1 10/09/2014   PLT 224.0 10/09/2014   GLUCOSE 91 10/09/2014   CHOL 183 10/09/2014   TRIG 99.0  10/09/2014   HDL 72.40 10/09/2014   LDLDIRECT 120.7 05/02/2010   LDLCALC 91 10/09/2014   ALT 13 10/09/2014   AST 19 10/09/2014   NA 136 10/09/2014   K 4.4 10/09/2014   CL 102 10/09/2014   CREATININE 0.67 10/09/2014   BUN 13 10/09/2014   CO2 27 10/09/2014   TSH 2.81 10/09/2014   INR 1.0 07/23/2008   HGBA1C  07/23/2008    5.7 (NOTE)   The ADA recommends the following therapeutic goal for glycemic   control related to Hgb A1C measurement:   Goal of Therapy:   < 7.0% Hgb A1C   Reference: American Diabetes Association: Clinical Practice   Recommendations 2008, Diabetes Care,  2008, 31:(Suppl 1).    ASSESSMENT AND PLAN:  Discussed the following assessment and plan:  Persistent cough for 3 weeks or longer - poss allergic consdier ELR and upper airway problem hx of spots on lungs and cleared by pulmonary in past cxray see text - Plan: DG Chest 2 View  Chronic throat clearing Fu  If not getting better  May bet better when moves disc alarm sx  -Patient advised to return or notify health care team  if symptoms worsen ,persist or new concerns arise.  Patient Instructions  Lots of causes . For cough  exam is reassuring today .  Poss allergic  Or even esophageal reflux possible Get chest x ray  Stop the cough drops and can try sugar  Free candy.   And gum instead.   Can try OTC  antihistamine short time. claritin and  Allegra .  Sometimes  Acid blockers such as prilosec for 6 weeks can help .but can  Do antireflux measures sucha aas small meals  . No eating last at night .    Cough, Adult Coughing is a reflex that clears your throat and your airways. Coughing helps to heal and protect your lungs. It is normal to cough occasionally, but a cough that happens with other symptoms or lasts a long time may be a sign of a condition that needs treatment. A cough may last only 2-3 weeks (acute), or it may last longer than 8 weeks (chronic). CAUSES Coughing is commonly caused by:  Breathing in  substances that irritate your lungs.  A viral or bacterial  respiratory infection.  Allergies.  Asthma.  Postnasal drip.  Smoking.  Acid backing up from the stomach into the esophagus (gastroesophageal reflux).  Certain medicines.  Chronic lung problems, including COPD (or rarely, lung cancer).  Other medical conditions such as heart failure. HOME CARE INSTRUCTIONS  Pay attention to any changes in your symptoms. Take these actions to help with your discomfort:  Take medicines only as told by your health care provider.  If you were prescribed an antibiotic medicine, take it as told by your health care provider. Do not stop taking the antibiotic even if you start to feel better.  Talk with your health care provider before you take a cough suppressant medicine.  Drink enough fluid to keep your urine clear or pale yellow.  If the air is dry, use a cold steam vaporizer or humidifier in your bedroom or your home to help loosen secretions.  Avoid anything that causes you to cough at work or at home.  If your cough is worse at night, try sleeping in a semi-upright position.  Avoid cigarette smoke. If you smoke, quit smoking. If you need help quitting, ask your health care provider.  Avoid caffeine.  Avoid alcohol.  Rest as needed. SEEK MEDICAL CARE IF:   You have new symptoms.  You cough up pus.  Your cough does not get better after 2-3 weeks, or your cough gets worse.  You cannot control your cough with suppressant medicines and you are losing sleep.  You develop pain that is getting worse or pain that is not controlled with pain medicines.  You have a fever.  You have unexplained weight loss.  You have night sweats. SEEK IMMEDIATE MEDICAL CARE IF:  You cough up blood.  You have difficulty breathing.  Your heartbeat is very fast.   This information is not intended to replace advice given to you by your health care provider. Make sure you discuss any  questions you have with your health care provider.   Document Released: 02/20/2011 Document Revised: 05/15/2015 Document Reviewed: 10/31/2014 Elsevier Interactive Patient Education 2016 [place] K. Panosh M.D.

## 2015-08-20 DIAGNOSIS — D860 Sarcoidosis of lung: Secondary | ICD-10-CM | POA: Diagnosis not present

## 2015-08-20 DIAGNOSIS — R5383 Other fatigue: Secondary | ICD-10-CM | POA: Diagnosis not present

## 2015-08-20 DIAGNOSIS — E039 Hypothyroidism, unspecified: Secondary | ICD-10-CM | POA: Diagnosis not present

## 2015-08-20 DIAGNOSIS — E559 Vitamin D deficiency, unspecified: Secondary | ICD-10-CM | POA: Diagnosis not present

## 2015-08-20 DIAGNOSIS — R799 Abnormal finding of blood chemistry, unspecified: Secondary | ICD-10-CM | POA: Diagnosis not present

## 2015-12-19 DIAGNOSIS — I10 Essential (primary) hypertension: Secondary | ICD-10-CM | POA: Diagnosis not present

## 2015-12-19 DIAGNOSIS — M419 Scoliosis, unspecified: Secondary | ICD-10-CM | POA: Diagnosis not present

## 2015-12-19 DIAGNOSIS — R5383 Other fatigue: Secondary | ICD-10-CM | POA: Diagnosis not present

## 2015-12-19 DIAGNOSIS — M545 Low back pain: Secondary | ICD-10-CM | POA: Diagnosis not present

## 2015-12-19 DIAGNOSIS — R918 Other nonspecific abnormal finding of lung field: Secondary | ICD-10-CM | POA: Diagnosis not present

## 2015-12-19 DIAGNOSIS — R42 Dizziness and giddiness: Secondary | ICD-10-CM | POA: Diagnosis not present

## 2015-12-19 DIAGNOSIS — Z0000 Encounter for general adult medical examination without abnormal findings: Secondary | ICD-10-CM | POA: Diagnosis not present

## 2015-12-20 DIAGNOSIS — R5383 Other fatigue: Secondary | ICD-10-CM | POA: Diagnosis not present

## 2015-12-20 DIAGNOSIS — R42 Dizziness and giddiness: Secondary | ICD-10-CM | POA: Diagnosis not present

## 2016-01-03 DIAGNOSIS — E639 Nutritional deficiency, unspecified: Secondary | ICD-10-CM | POA: Diagnosis not present

## 2016-01-03 DIAGNOSIS — E559 Vitamin D deficiency, unspecified: Secondary | ICD-10-CM | POA: Diagnosis not present

## 2016-01-03 DIAGNOSIS — R5383 Other fatigue: Secondary | ICD-10-CM | POA: Diagnosis not present

## 2016-01-03 DIAGNOSIS — E7800 Pure hypercholesterolemia, unspecified: Secondary | ICD-10-CM | POA: Diagnosis not present

## 2016-01-03 DIAGNOSIS — I2510 Atherosclerotic heart disease of native coronary artery without angina pectoris: Secondary | ICD-10-CM | POA: Diagnosis not present

## 2016-01-03 DIAGNOSIS — D860 Sarcoidosis of lung: Secondary | ICD-10-CM | POA: Diagnosis not present

## 2016-02-12 ENCOUNTER — Other Ambulatory Visit: Payer: Medicare Other

## 2016-02-19 ENCOUNTER — Encounter: Payer: Medicare Other | Admitting: Internal Medicine

## 2016-02-25 ENCOUNTER — Telehealth: Payer: Self-pay | Admitting: Internal Medicine

## 2016-02-25 ENCOUNTER — Ambulatory Visit (INDEPENDENT_AMBULATORY_CARE_PROVIDER_SITE_OTHER): Payer: Medicare Other | Admitting: Internal Medicine

## 2016-02-25 ENCOUNTER — Encounter: Payer: Self-pay | Admitting: Internal Medicine

## 2016-02-25 ENCOUNTER — Ambulatory Visit (INDEPENDENT_AMBULATORY_CARE_PROVIDER_SITE_OTHER)
Admission: RE | Admit: 2016-02-25 | Discharge: 2016-02-25 | Disposition: A | Discharge status: Home / Self Care | Payer: Medicare Other | Source: Ambulatory Visit | Attending: Internal Medicine | Admitting: Internal Medicine

## 2016-02-25 VITALS — BP 200/120 | Temp 98.3°F | Ht 59.0 in | Wt 126.9 lb

## 2016-02-25 DIAGNOSIS — M795 Residual foreign body in soft tissue: Secondary | ICD-10-CM

## 2016-02-25 DIAGNOSIS — IMO0001 Reserved for inherently not codable concepts without codable children: Secondary | ICD-10-CM

## 2016-02-25 DIAGNOSIS — R030 Elevated blood-pressure reading, without diagnosis of hypertension: Secondary | ICD-10-CM | POA: Diagnosis not present

## 2016-02-25 DIAGNOSIS — M79671 Pain in right foot: Secondary | ICD-10-CM

## 2016-02-25 DIAGNOSIS — R2241 Localized swelling, mass and lump, right lower limb: Secondary | ICD-10-CM | POA: Diagnosis not present

## 2016-02-25 MED ORDER — AMLODIPINE BESYLATE 2.5 MG PO TABS: 2.5000 mg | ORAL_TABLET | Freq: Every day | ORAL | Status: AC

## 2016-02-25 NOTE — Progress Notes (Signed)
Chief Complaint  Patient presents with  . Painful Bump on Rt Foot    Bottom on rt foot.    HPI: Kristin Jimenez 82 y.o.  sda appt   Dropped   Magnifying glass  on floor   Weeks ago    Almost positive    Stepped on soimething  And Has sore spot on the lateral mid right foot. Is able to walk and do her exercise. She should get it checked out. Thinks it's possible she has a small amount of glass in her foot. Needs to travel out of town this weekend to a wedding in [place]. She has since moved doing well blood pressure is good at home feels it is up today because she is here. Usually in the 10/07/2018 range. Her blood pressure today was 144/77 ROS: See pertinent positives and negatives per HPI.  Past Medical History  Diagnosis Date  . Osteoarthritis   . Osteopenia   . Hypertension   . Allergic rhinitis   . Ocular migraine     Consult - [facility]i  hospitalized  . Sarcoidosis ([place])   . Retinal tear     OS  . Scoliosis   . Chronic back pain   . Pulmonary nodule     [facility]  . Diverticulosis   . Cataract   . Agatston coronary artery calcium score less than 100 09/17/2012    4.4  lad 25%ile for age     Family History  Problem Relation Age of Onset  . Aortic aneurysm    . Stroke    . Colon cancer Neg Hx   . Heart disease Mother   . Lung cancer Father   . Arthritis Father   . Hyperlipidemia Sister   . Arthritis Sister   . Diabetes Brother   . Stroke Brother   . Hyperlipidemia Brother     Social History   Social History  . Marital Status: Widowed    Spouse Name: N/A  . Number of Children: 6  . Years of Education: N/A   Occupational History  . retired    Social History Main Topics  . Smoking status: Never Smoker   . Smokeless tobacco: Never Used  . Alcohol Use: Yes     Comment: Wine, red or white every night  . Drug Use: No  . Sexual Activity: Not Asked   Other Topics Concern  . None   Social History Narrative   CONSULTS   Dr Leonie Man  - neuro in ED  Now Dr Brett Fairy      Retired,  Lives  daughters garage  apt   Widowed, husband passed away in 11/11/2007  Vegetarian mostly   Daily caffeine use   Regular exercise - YES   Plans to travel.                 Outpatient Prescriptions Prior to Visit  Medication Sig Dispense Refill  . Cholecalciferol (VITAMIN D PO) Take 2,000 Units by mouth daily.     . Cyanocobalamin (B-12 PO) Take by mouth.    . Magnesium 250 MG TABS Take by mouth at bedtime.     . Multiple Vitamins-Minerals (MULTIVITAMIN PO) Take by mouth.    . Probiotic Product (PROBIOTIC PO) Take by mouth.     No facility-administered medications prior to visit.     EXAM:  BP 200/120 mmHg  Temp(Src) 98.3 F (36.8 C) (Oral)  Ht 4\' 11"  (1.499 m)  Wt 126 lb 14.4  oz (57.561 kg)  BMI 25.62 kg/m2  Body mass index is 25.62 kg/(m^2).  GENERAL: vitals reviewed and listed above, alert, oriented, appears well hydrated and in no acute distress HEENT: atraumatic, conjunctiva  clear, no obvious abnormalities on inspection of external nose and ears MS: moves all extremities without noticeable focal  Abnormality gait is normal right foot with a small hyperkeratotic area irregular sent her with no foreign body seen on the right lateral foot near the base of the fifth metatarsal. No unusual redness streaking.Marland Kitchen PSYCH: pleasant and cooperative, no obvious depression or anxiety  ASSESSMENT AND PLAN:  Discussed the following assessment and plan:  Foot pain, right - Plan: DG Foot 2 Views Right  Foreign body (FB) in soft tissue possible - Assumption that she stepped on a sliver of glass almost 2 weeks ago. Discussed difficulty of taking out glass slight however check x-ray soaking if not coming  - Plan: DG Foot 2 Views Right  Elevated BP - pt states at goal at home and checks periodically  make sure comes down to normal consider exploration  No sx of infection at this time .  Get x ray to see if can help get idea of depth of  fb  ?  Than plan fu  -Patient advised to return or notify health care team  if symptoms worsen ,persist or new concerns arise.  Patient Instructions  Can get  X ray but I agree could be a small piece of glass .  Option to dig out.   Soak  at least twice - 3 x per day . If  persistent or progressive can be  Surgically explored    But  May be harder to get out than seems.    Make sure your blood pressure is good .     Standley Brooking. Panosh M.D.

## 2016-02-25 NOTE — Telephone Encounter (Signed)
Pt was seen today and bp was elevated. Pt would like to restart bp med and send to [facility]

## 2016-02-25 NOTE — Patient Instructions (Addendum)
Can get  X ray but I agree could be a small piece of glass .  Option to dig out.   Soak  at least twice - 3 x per day . If  persistent or progressive can be  Surgically explored    But  May be harder to get out than seems.    Make sure your blood pressure is good .

## 2016-02-25 NOTE — Progress Notes (Signed)
Pre visit review using our clinic review tool, if applicable. No additional management support is needed unless otherwise documented below in the visit note. 

## 2016-02-25 NOTE — Telephone Encounter (Signed)
Spoke to the pt.  Informed her that medication has been sent in.  She will call back if bp not coming down.  Unable to make an appt.  She is getting ready to go out of town.  Will call when she gets back.

## 2016-02-25 NOTE — Telephone Encounter (Signed)
She was on 3 different meds in the past   Had side effects with some of them  Add  Amlodipine 2. 5 mg per day   Disp 30 refill x 1   Suggest fu in 3-4 weeks   If bp not decreasing then increase to 5 mg per day .

## 2016-03-04 ENCOUNTER — Other Ambulatory Visit: Payer: Medicare Other

## 2016-03-09 ENCOUNTER — Encounter: Payer: Medicare Other | Admitting: Internal Medicine

## 2016-03-17 DIAGNOSIS — I10 Essential (primary) hypertension: Secondary | ICD-10-CM | POA: Diagnosis not present

## 2016-03-17 DIAGNOSIS — I340 Nonrheumatic mitral (valve) insufficiency: Secondary | ICD-10-CM | POA: Diagnosis not present

## 2016-03-17 DIAGNOSIS — I517 Cardiomegaly: Secondary | ICD-10-CM | POA: Diagnosis not present

## 2016-03-17 DIAGNOSIS — R42 Dizziness and giddiness: Secondary | ICD-10-CM | POA: Diagnosis not present

## 2016-03-17 DIAGNOSIS — R5383 Other fatigue: Secondary | ICD-10-CM | POA: Diagnosis not present

## 2016-04-13 DIAGNOSIS — L814 Other melanin hyperpigmentation: Secondary | ICD-10-CM | POA: Diagnosis not present

## 2016-04-13 DIAGNOSIS — D1801 Hemangioma of skin and subcutaneous tissue: Secondary | ICD-10-CM | POA: Diagnosis not present

## 2016-04-13 DIAGNOSIS — L821 Other seborrheic keratosis: Secondary | ICD-10-CM | POA: Diagnosis not present

## 2016-04-13 DIAGNOSIS — D225 Melanocytic nevi of trunk: Secondary | ICD-10-CM | POA: Diagnosis not present

## 2016-04-13 DIAGNOSIS — L820 Inflamed seborrheic keratosis: Secondary | ICD-10-CM | POA: Diagnosis not present

## 2016-04-16 DIAGNOSIS — H6123 Impacted cerumen, bilateral: Secondary | ICD-10-CM | POA: Insufficient documentation

## 2016-04-16 DIAGNOSIS — H900 Conductive hearing loss, bilateral: Secondary | ICD-10-CM | POA: Insufficient documentation

## 2016-05-05 ENCOUNTER — Other Ambulatory Visit: Payer: Self-pay

## 2016-05-18 DIAGNOSIS — Z961 Presence of intraocular lens: Secondary | ICD-10-CM | POA: Diagnosis not present

## 2016-05-19 ENCOUNTER — Encounter: Payer: Self-pay | Admitting: Internal Medicine

## 2016-05-19 ENCOUNTER — Ambulatory Visit (INDEPENDENT_AMBULATORY_CARE_PROVIDER_SITE_OTHER): Payer: Medicare Other | Admitting: Internal Medicine

## 2016-05-19 VITALS — BP 148/80 | Temp 98.1°F | Ht 59.0 in | Wt 127.2 lb

## 2016-05-19 DIAGNOSIS — R030 Elevated blood-pressure reading, without diagnosis of hypertension: Secondary | ICD-10-CM

## 2016-05-19 DIAGNOSIS — F418 Other specified anxiety disorders: Secondary | ICD-10-CM | POA: Diagnosis not present

## 2016-05-19 DIAGNOSIS — I10 Essential (primary) hypertension: Secondary | ICD-10-CM | POA: Diagnosis not present

## 2016-05-19 DIAGNOSIS — Z79899 Other long term (current) drug therapy: Secondary | ICD-10-CM | POA: Diagnosis not present

## 2016-05-19 DIAGNOSIS — IMO0001 Reserved for inherently not codable concepts without codable children: Secondary | ICD-10-CM

## 2016-05-19 NOTE — Patient Instructions (Addendum)
Try the who dose of 2.5 mg per day .   Take at night    BP came downs after a while  but still up .   Anxiety can aggravate it.   Bring your  Machine in to next visit .   Goal  is still below 140/90 because you are healthy .     ROV  In about a month  When you are here .

## 2016-05-19 NOTE — Progress Notes (Signed)
Pre visit review using our clinic review tool, if applicable. No additional management support is needed unless otherwise documented below in the visit note.  Chief Complaint  Patient presents with  . Follow-up    HPI: Kristin Jimenez 82 y.o.   Fu  BP and wc ht cbd drop some kind of natrual cannabinoid?  For anxiety  only taking 1/2 of 2.5 amlodipine some light headedness     Prefers no meds and" Natural better " 126/72   After xexercise and then after   Goes up to 166   Lots of anxiety  And poss related   Likes to be around people going placed on her own causes some anxiety  Taking " natural topical and drops   For back and anxiety   Going [place] ROS: See pertinent positives and negatives per HPI.  Past Medical History:  Diagnosis Date  . Agatston coronary artery calcium score less than 100 09/17/2012   4.4  lad 25%ile for age   . Allergic rhinitis   . Cataract   . Chronic back pain   . Diverticulosis   . Hypertension   . Ocular migraine    Consult - Sethi  hospitalized  . Osteoarthritis   . Osteopenia   . Pulmonary nodule    [facility]  . Retinal tear    OS  . Sarcoidosis ([place])   . Scoliosis     Family History  Problem Relation Age of Onset  . Aortic aneurysm    . Stroke    . Colon cancer Neg Hx   . Heart disease Mother   . Lung cancer Father   . Arthritis Father   . Hyperlipidemia Sister   . Arthritis Sister   . Diabetes Brother   . Stroke Brother   . Hyperlipidemia Brother     Social History   Social History  . Marital status: Widowed    Spouse name: N/A  . Number of children: 6  . Years of education: N/A   Occupational History  . retired Retired   Social History Main Topics  . Smoking status: Never Smoker  . Smokeless tobacco: Never Used  . Alcohol use Yes     Comment: Wine, red or white every night  . Drug use: No  . Sexual activity: Not Asked   Other Topics Concern  . None   Social History Narrative     CONSULTS   Dr Leonie Man - neuro in ED  Now Dr Brett Fairy      Retired,  Lives  daughters garage  apt   Widowed, husband passed away in 2007/11/16  Vegetarian mostly   Daily caffeine use   Regular exercise - YES   Plans to travel.                 Outpatient Medications Prior to Visit  Medication Sig Dispense Refill  . amLODipine (NORVASC) 2.5 MG tablet Take 1 tablet (2.5 mg total) by mouth daily. 30 tablet 1  . Cholecalciferol (VITAMIN D PO) Take 2,000 Units by mouth daily.     . Cyanocobalamin (B-12 PO) Take by mouth.    . Magnesium 250 MG TABS Take by mouth at bedtime.     . Multiple Vitamins-Minerals (MULTIVITAMIN PO) Take by mouth.    . Probiotic Product (PROBIOTIC PO) Take by mouth.     No facility-administered medications prior to visit.      EXAM:  BP (!) 148/80 (BP Location: Left  Arm)   Temp 98.1 F (36.7 C) (Oral)   Ht 4\' 11"  (1.499 m)   Wt 127 lb 3.2 oz (57.7 kg)   BMI 25.69 kg/m   Body mass index is 25.69 kg/m. bp reapeat is better but still not a t goal  GENERAL: vitals reviewed and listed above, alert, oriented, appears well hydrated and in no acute distress HEENT: atraumatic, conjunctiva  clear, no obvious abnormalities on inspection of external nose and earsNECK: no obvious masses on inspection palpation   CV: HRRR, no clubbing cyanosis or  peripheral edema nl cap refill  MS: moves all extremities without noticeable focal  abnormality PSYCH: pleasant and cooperative, no obvious depression or anxiety BP Readings from Last 3 Encounters:  05/19/16 (!) 148/80  02/25/16 (!) 200/120  07/18/15 (!) 146/96   Lab Results  Component Value Date   WBC 5.4 10/09/2014   HGB 13.2 10/09/2014   HCT 39.1 10/09/2014   PLT 224.0 10/09/2014   GLUCOSE 91 10/09/2014   CHOL 183 10/09/2014   TRIG 99.0 10/09/2014   HDL 72.40 10/09/2014   LDLDIRECT 120.7 05/02/2010   LDLCALC 91 10/09/2014   ALT 13 10/09/2014   AST 19 10/09/2014   NA 136 10/09/2014   K 4.4 10/09/2014    CL 102 10/09/2014   CREATININE 0.67 10/09/2014   BUN 13 10/09/2014   CO2 27 10/09/2014   TSH 2.81 10/09/2014   INR 1.0 07/23/2008   HGBA1C  07/23/2008    5.7 (NOTE)   The ADA recommends the following therapeutic goal for glycemic   control related to Hgb A1C measurement:   Goal of Therapy:   < 7.0% Hgb A1C   Reference: American Diabetes Association: Clinical Practice   Recommendations 2008, Diabetes Care,  2008, 31:(Suppl 1).    ASSESSMENT AND PLAN:  Discussed the following assessment and plan:  Essential hypertension - better after sitting but in med take at night and fu in 1 month or prn  Medication management  White coat hypertension  Situational anxiety - aggravating  bp disc  declines controller med  disc strategies other  what is her bp really ?   Total visit 47mins > 50% spent counseling and coordinating care as indicated in above note and in instructions to patient .     -Patient advised to return or notify health care team  if symptoms worsen ,persist or new concerns arise.  Patient Instructions  Try the who dose of 2.5 mg per day .   Take at night    BP came downs after a while  but still up .   Anxiety can aggravate it.   Bring your  Machine in to next visit .   Goal  is still below 140/90 because you are healthy .     ROV  In about a month  When you are here .     Standley Brooking. Josey Dettmann M.D.

## 2016-05-29 DIAGNOSIS — R739 Hyperglycemia, unspecified: Secondary | ICD-10-CM | POA: Diagnosis not present

## 2016-05-29 DIAGNOSIS — E559 Vitamin D deficiency, unspecified: Secondary | ICD-10-CM | POA: Diagnosis not present

## 2016-05-29 DIAGNOSIS — R5383 Other fatigue: Secondary | ICD-10-CM | POA: Diagnosis not present

## 2016-05-29 DIAGNOSIS — E039 Hypothyroidism, unspecified: Secondary | ICD-10-CM | POA: Diagnosis not present

## 2016-05-29 DIAGNOSIS — E279 Disorder of adrenal gland, unspecified: Secondary | ICD-10-CM | POA: Diagnosis not present

## 2016-05-29 DIAGNOSIS — E7210 Disorders of sulfur-bearing amino-acid metabolism, unspecified: Secondary | ICD-10-CM | POA: Diagnosis not present

## 2016-05-29 DIAGNOSIS — E7800 Pure hypercholesterolemia, unspecified: Secondary | ICD-10-CM | POA: Diagnosis not present

## 2016-05-29 DIAGNOSIS — D509 Iron deficiency anemia, unspecified: Secondary | ICD-10-CM | POA: Diagnosis not present

## 2016-05-29 DIAGNOSIS — N951 Menopausal and female climacteric states: Secondary | ICD-10-CM | POA: Diagnosis not present

## 2016-06-19 ENCOUNTER — Ambulatory Visit: Payer: Medicare Other | Admitting: Internal Medicine

## 2016-08-03 ENCOUNTER — Ambulatory Visit (INDEPENDENT_AMBULATORY_CARE_PROVIDER_SITE_OTHER): Payer: Medicare Other | Admitting: Podiatry

## 2016-08-03 ENCOUNTER — Ambulatory Visit (INDEPENDENT_AMBULATORY_CARE_PROVIDER_SITE_OTHER): Payer: Medicare Other

## 2016-08-03 ENCOUNTER — Encounter: Payer: Self-pay | Admitting: Podiatry

## 2016-08-03 ENCOUNTER — Telehealth: Payer: Self-pay | Admitting: Internal Medicine

## 2016-08-03 VITALS — BP 131/95 | HR 102 | Resp 16 | Ht 59.0 in | Wt 128.0 lb

## 2016-08-03 DIAGNOSIS — B070 Plantar wart: Secondary | ICD-10-CM | POA: Diagnosis not present

## 2016-08-03 DIAGNOSIS — L905 Scar conditions and fibrosis of skin: Secondary | ICD-10-CM | POA: Diagnosis not present

## 2016-08-03 DIAGNOSIS — L819 Disorder of pigmentation, unspecified: Secondary | ICD-10-CM

## 2016-08-03 DIAGNOSIS — M79671 Pain in right foot: Secondary | ICD-10-CM

## 2016-08-03 DIAGNOSIS — L988 Other specified disorders of the skin and subcutaneous tissue: Secondary | ICD-10-CM | POA: Diagnosis not present

## 2016-08-03 NOTE — Telephone Encounter (Signed)
Spoke to Kristin Jimenez. Pt stated that she will call office back to schedule awv appt.

## 2016-08-03 NOTE — Patient Instructions (Signed)

## 2016-08-03 NOTE — Progress Notes (Signed)
Subjective:     Patient ID: Kristin Jimenez, female   DOB: 1933/12/19, 82 y.o.   MRN: [id number]  HPI patient presents stating I have a lesion underneath my right foot that sore and I may have stepped on something but I don't know but it is bothersome   Review of Systems  All other systems reviewed and are negative.      Objective:   Physical Exam  Constitutional: She is oriented to person, place, and time.  Cardiovascular: Intact distal pulses.   Musculoskeletal: Normal range of motion.  Neurological: She is oriented to person, place, and time.  Skin: Skin is warm.  Nursing note and vitals reviewed.  neurovascular status intact muscle strength adequate range of motion within normal limits with patient noted to have a lesion underneath the right foot that measures approximately 1.2 cm x 8 mm and is painful to lateral pressure. Patient does not remember specific injury but states it's been present for the last month or 2 patient's found have good digital perfusion well oriented 3     Assessment:     Verruca plantaris plantar aspect right foot or other unknown soft tissue lesion    Plan:     H&P condition reviewed and recommended excision of mass. I did a proximal nerve block 60 mg Xylocaine Marcaine with epinephrine and sterile prep was applied to the area and then utilizing sterile technique the lesion was excised entirely and phenol applied to the base with sterile dressing. I placed in formalin and sent for pathological evaluation and we'll review this with patient and patient does understand that this does have to heel and that there is a possibility that it could recur

## 2016-08-03 NOTE — Progress Notes (Signed)
   Subjective:    Patient ID: Kristin Jimenez, female    DOB: [birth year], 82 y.o.   MRN: [id number]  HPI Chief Complaint  Patient presents with  . Foot Pain    Right foot; lateral side-below 5th toe; pt stated, "Thinks they stepped on slivers of glass last summer (July 2017)"      Review of Systems  All other systems reviewed and are negative.      Objective:   Physical Exam        Assessment & Plan:

## 2016-08-06 DIAGNOSIS — M1991 Primary osteoarthritis, unspecified site: Secondary | ICD-10-CM | POA: Diagnosis not present

## 2016-08-06 DIAGNOSIS — R5383 Other fatigue: Secondary | ICD-10-CM | POA: Diagnosis not present

## 2016-08-06 DIAGNOSIS — M5136 Other intervertebral disc degeneration, lumbar region: Secondary | ICD-10-CM | POA: Diagnosis not present

## 2016-08-26 ENCOUNTER — Telehealth: Payer: Self-pay | Admitting: *Deleted

## 2016-08-26 NOTE — Telephone Encounter (Addendum)
Pt states 08/03/2016 Dr. Paulla Dolly biopsied an area and it still hurts. I asked pt if the area had any redness or drainage. Pt denies redness, or drainage, states okay to walk on but painful to press on the scab. I told pt I felt it maybe the scab being pressed in to the new tender skin, that I would call for the biopsy results and have for Dr. Paulla Dolly when he was next in the office. I offered pt an appt next week and she stated she was going to [place] and would make an appt once she returned. Randon Goldsmith states report is final and will fax to [phone], and biopsy results were received. 09/02/2016-Dr. Regal reviewed 08/03/2016 biopsy as inflamed scar. Left message for pt to call for results. 09/03/2016-Pt called for results. I informed pt of Dr. Mellody Drown review of the biopsy report.

## 2016-09-16 ENCOUNTER — Telehealth: Payer: Self-pay | Admitting: Internal Medicine

## 2016-09-16 NOTE — Telephone Encounter (Signed)
Misty pt would like to get her CPE in January is it okay to take two SD?

## 2016-09-16 NOTE — Telephone Encounter (Signed)
Ok  As long as some sdas remain

## 2016-09-17 NOTE — Telephone Encounter (Signed)
Pt scheduled  

## 2016-09-24 ENCOUNTER — Encounter: Payer: Medicare Other | Admitting: Internal Medicine

## 2016-09-28 ENCOUNTER — Other Ambulatory Visit: Payer: Self-pay | Admitting: Internal Medicine

## 2016-09-28 DIAGNOSIS — Z0000 Encounter for general adult medical examination without abnormal findings: Secondary | ICD-10-CM | POA: Diagnosis not present

## 2016-09-28 DIAGNOSIS — M1991 Primary osteoarthritis, unspecified site: Secondary | ICD-10-CM | POA: Diagnosis not present

## 2016-09-28 DIAGNOSIS — E785 Hyperlipidemia, unspecified: Secondary | ICD-10-CM | POA: Diagnosis not present

## 2016-09-28 DIAGNOSIS — R5383 Other fatigue: Secondary | ICD-10-CM | POA: Diagnosis not present

## 2016-09-28 DIAGNOSIS — Z1231 Encounter for screening mammogram for malignant neoplasm of breast: Secondary | ICD-10-CM

## 2016-09-29 ENCOUNTER — Ambulatory Visit
Admission: RE | Admit: 2016-09-29 | Discharge: 2016-09-29 | Disposition: A | Discharge status: Home / Self Care | Payer: Medicare Other | Source: Ambulatory Visit | Attending: Internal Medicine | Admitting: Internal Medicine

## 2016-09-29 DIAGNOSIS — Z1231 Encounter for screening mammogram for malignant neoplasm of breast: Secondary | ICD-10-CM | POA: Diagnosis not present

## 2016-10-02 DIAGNOSIS — M5136 Other intervertebral disc degeneration, lumbar region: Secondary | ICD-10-CM | POA: Diagnosis not present

## 2016-10-02 DIAGNOSIS — R5383 Other fatigue: Secondary | ICD-10-CM | POA: Diagnosis not present

## 2016-10-02 DIAGNOSIS — M1991 Primary osteoarthritis, unspecified site: Secondary | ICD-10-CM | POA: Diagnosis not present

## 2016-11-19 DIAGNOSIS — H04123 Dry eye syndrome of bilateral lacrimal glands: Secondary | ICD-10-CM | POA: Diagnosis not present

## 2016-12-02 DIAGNOSIS — R109 Unspecified abdominal pain: Secondary | ICD-10-CM | POA: Diagnosis not present

## 2016-12-02 DIAGNOSIS — M545 Low back pain: Secondary | ICD-10-CM | POA: Diagnosis not present

## 2016-12-02 DIAGNOSIS — I10 Essential (primary) hypertension: Secondary | ICD-10-CM | POA: Diagnosis not present

## 2016-12-02 DIAGNOSIS — M79652 Pain in left thigh: Secondary | ICD-10-CM | POA: Diagnosis not present

## 2016-12-11 DIAGNOSIS — R42 Dizziness and giddiness: Secondary | ICD-10-CM | POA: Diagnosis not present

## 2016-12-11 DIAGNOSIS — M5136 Other intervertebral disc degeneration, lumbar region: Secondary | ICD-10-CM | POA: Diagnosis not present

## 2016-12-11 DIAGNOSIS — M545 Low back pain: Secondary | ICD-10-CM | POA: Diagnosis not present

## 2016-12-11 DIAGNOSIS — I10 Essential (primary) hypertension: Secondary | ICD-10-CM | POA: Diagnosis not present

## 2016-12-11 DIAGNOSIS — M4696 Unspecified inflammatory spondylopathy, lumbar region: Secondary | ICD-10-CM | POA: Diagnosis not present

## 2016-12-11 DIAGNOSIS — M50321 Other cervical disc degeneration at C4-C5 level: Secondary | ICD-10-CM | POA: Diagnosis not present

## 2016-12-11 DIAGNOSIS — F419 Anxiety disorder, unspecified: Secondary | ICD-10-CM | POA: Diagnosis not present

## 2016-12-11 DIAGNOSIS — M8580 Other specified disorders of bone density and structure, unspecified site: Secondary | ICD-10-CM | POA: Diagnosis not present

## 2016-12-11 DIAGNOSIS — Z981 Arthrodesis status: Secondary | ICD-10-CM | POA: Diagnosis not present

## 2016-12-11 DIAGNOSIS — Z0000 Encounter for general adult medical examination without abnormal findings: Secondary | ICD-10-CM | POA: Diagnosis not present

## 2016-12-11 DIAGNOSIS — M47812 Spondylosis without myelopathy or radiculopathy, cervical region: Secondary | ICD-10-CM | POA: Diagnosis not present

## 2016-12-11 DIAGNOSIS — H6123 Impacted cerumen, bilateral: Secondary | ICD-10-CM | POA: Diagnosis not present

## 2016-12-11 DIAGNOSIS — Q675 Congenital deformity of spine: Secondary | ICD-10-CM | POA: Diagnosis not present

## 2016-12-11 DIAGNOSIS — M4312 Spondylolisthesis, cervical region: Secondary | ICD-10-CM | POA: Diagnosis not present

## 2016-12-11 DIAGNOSIS — M419 Scoliosis, unspecified: Secondary | ICD-10-CM | POA: Diagnosis not present

## 2016-12-11 DIAGNOSIS — R5383 Other fatigue: Secondary | ICD-10-CM | POA: Diagnosis not present

## 2017-01-12 DIAGNOSIS — R410 Disorientation, unspecified: Secondary | ICD-10-CM | POA: Diagnosis not present

## 2017-01-12 DIAGNOSIS — G43109 Migraine with aura, not intractable, without status migrainosus: Secondary | ICD-10-CM | POA: Diagnosis not present

## 2017-01-13 ENCOUNTER — Other Ambulatory Visit: Payer: Self-pay | Admitting: Family Medicine

## 2017-01-13 DIAGNOSIS — R410 Disorientation, unspecified: Secondary | ICD-10-CM

## 2017-01-13 DIAGNOSIS — G43109 Migraine with aura, not intractable, without status migrainosus: Secondary | ICD-10-CM

## 2017-01-14 ENCOUNTER — Ambulatory Visit
Admission: RE | Admit: 2017-01-14 | Discharge: 2017-01-14 | Disposition: A | Discharge status: Home / Self Care | Payer: Medicare Other | Source: Ambulatory Visit | Attending: Family Medicine | Admitting: Family Medicine

## 2017-01-14 ENCOUNTER — Other Ambulatory Visit: Payer: Self-pay | Admitting: Internal Medicine

## 2017-01-14 DIAGNOSIS — R410 Disorientation, unspecified: Secondary | ICD-10-CM

## 2017-01-14 DIAGNOSIS — G43909 Migraine, unspecified, not intractable, without status migrainosus: Secondary | ICD-10-CM | POA: Diagnosis not present

## 2017-01-14 DIAGNOSIS — G43109 Migraine with aura, not intractable, without status migrainosus: Secondary | ICD-10-CM

## 2017-02-08 DIAGNOSIS — D225 Melanocytic nevi of trunk: Secondary | ICD-10-CM | POA: Diagnosis not present

## 2017-02-08 DIAGNOSIS — L814 Other melanin hyperpigmentation: Secondary | ICD-10-CM | POA: Diagnosis not present

## 2017-02-08 DIAGNOSIS — L820 Inflamed seborrheic keratosis: Secondary | ICD-10-CM | POA: Diagnosis not present

## 2017-02-08 DIAGNOSIS — L821 Other seborrheic keratosis: Secondary | ICD-10-CM | POA: Diagnosis not present

## 2017-02-09 DIAGNOSIS — M1991 Primary osteoarthritis, unspecified site: Secondary | ICD-10-CM | POA: Diagnosis not present

## 2017-02-09 DIAGNOSIS — R5383 Other fatigue: Secondary | ICD-10-CM | POA: Diagnosis not present

## 2017-02-09 DIAGNOSIS — G43109 Migraine with aura, not intractable, without status migrainosus: Secondary | ICD-10-CM | POA: Diagnosis not present

## 2017-02-09 DIAGNOSIS — I10 Essential (primary) hypertension: Secondary | ICD-10-CM | POA: Diagnosis not present

## 2017-04-29 DIAGNOSIS — F411 Generalized anxiety disorder: Secondary | ICD-10-CM | POA: Diagnosis not present

## 2017-04-29 DIAGNOSIS — R000 Tachycardia, unspecified: Secondary | ICD-10-CM | POA: Diagnosis not present

## 2017-05-11 DIAGNOSIS — D1801 Hemangioma of skin and subcutaneous tissue: Secondary | ICD-10-CM | POA: Diagnosis not present

## 2017-05-11 DIAGNOSIS — D225 Melanocytic nevi of trunk: Secondary | ICD-10-CM | POA: Diagnosis not present

## 2017-05-11 DIAGNOSIS — L821 Other seborrheic keratosis: Secondary | ICD-10-CM | POA: Diagnosis not present

## 2017-05-12 DIAGNOSIS — G4733 Obstructive sleep apnea (adult) (pediatric): Secondary | ICD-10-CM | POA: Diagnosis not present

## 2017-05-12 DIAGNOSIS — I10 Essential (primary) hypertension: Secondary | ICD-10-CM | POA: Diagnosis not present

## 2017-05-12 DIAGNOSIS — F419 Anxiety disorder, unspecified: Secondary | ICD-10-CM | POA: Diagnosis not present

## 2017-05-12 DIAGNOSIS — R05 Cough: Secondary | ICD-10-CM | POA: Diagnosis not present

## 2017-05-28 ENCOUNTER — Encounter: Payer: Self-pay | Admitting: Internal Medicine

## 2017-06-14 ENCOUNTER — Encounter: Payer: Self-pay | Admitting: Neurology

## 2017-06-16 ENCOUNTER — Encounter (INDEPENDENT_AMBULATORY_CARE_PROVIDER_SITE_OTHER): Payer: Self-pay

## 2017-06-16 ENCOUNTER — Encounter: Payer: Self-pay | Admitting: Neurology

## 2017-06-16 ENCOUNTER — Ambulatory Visit (INDEPENDENT_AMBULATORY_CARE_PROVIDER_SITE_OTHER): Payer: Medicare Other | Admitting: Neurology

## 2017-06-16 VITALS — BP 132/77 | HR 95 | Ht 59.0 in | Wt 128.0 lb

## 2017-06-16 DIAGNOSIS — G4719 Other hypersomnia: Secondary | ICD-10-CM | POA: Insufficient documentation

## 2017-06-16 DIAGNOSIS — R0683 Snoring: Secondary | ICD-10-CM

## 2017-06-16 NOTE — Progress Notes (Signed)
SLEEP MEDICINE CLINIC   Provider:  Larey Seat, [place] D  Primary Care Physician:  Merrilee Seashore, MD   Referring Provider: Ashby Dawes, MD    Chief Complaint  Patient presents with  . New Patient (Initial Visit)    pt alone, rm 10. pt lives alone and when she visited kids they told her she snores. pt doesnt wake up gasping for air. pt feels tired all the time despite 8 hrs of sleep    HPI:  Kristin Jimenez is a 83 y.o. female , seen here as in a referral/ revisit  from Dr. Regis Bill for a sleep evaluation. 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 She is now widowed and travels mostly to see family. Her daughter reported she snores but the patient has not woken herself up from snoring, choking or apnea. The patient carries a diagnosis of anxiety, possible obstructive sleep apnea again she has not been evaluated, Epworth Sleepiness Scale is 14 out of 24 points, she also has intermittent coughing has never smoked and a recent chest x-ray was negative. She was seen by Dr. Ashby Dawes on 05/12/2017 and he diagnosed also essential hypertension. She has migraines with auras.   Chief complaint according to patient : " I may snore" I am tired and anxious"  Sleep habits are as follows:the patient states she gets bored at night, she feels safe in her home, she is usually home by 7:30 after dinner. She reads on her kindle in bed. She likes to stay up until 10:30-11 PM but struggles to not sleep earlier. She likes to sleep until 03/13/1927 and feels that she would wake up earlier if he would go to bed earlier. She usually gets nocturnally 7-8 hours of sleep, uninterrupted. She sleeps on her side and on one pillow.  No nocturia, no palpitations no diaphoresis nor nausea dizziness. No chest pain. She may take a 20 minute power nap after lunch, wakes up refreshed. Overall she feels that her sleep is good. She is more fatigued than sleepy and she  feels BORED .  Sleep medical history and family sleep history: The patient has a family history of hypertension and diabetes,  Daughter with DM, ESRD and CHF.  Social history: mother of 6 children-  non smoker, drinks wine rarely, brandy, not beer. 1-2 a week. Widowed- husband died of lung cancer. Lives in the back of her daughter's [facility] home.    Review of Systems: Out of a complete 14 system review, the patient complains of only the following symptoms, and all other reviewed systems are negative.    Epworth score 7 , Fatigue severity score 16  , depression score 3/15   Social History   Social History  . Marital status: Widowed    Spouse name: N/A  . Number of children: 6  . Years of education: N/A   Occupational History  . retired Retired   Social History Main Topics  . Smoking status: Never Smoker  . Smokeless tobacco: Never Used  . Alcohol use Yes     Comment: Wine, red or white every night  . Drug use: No  . Sexual activity: Not on file   Other Topics Concern  . Not on file   Social History Narrative   CONSULTS   Dr Leonie Man - neuro in ED  Now Dr Brett Fairy      Retired,  Lives  daughters garage  apt   Widowed, husband passed away in 12-15-2022  2009   Vegetarian mostly   Daily caffeine use   Regular exercise - YES   Plans to travel.                 Family History  Problem Relation Age of Onset  . Heart disease Mother   . Lung cancer Father   . Arthritis Father   . Hyperlipidemia Sister   . Arthritis Sister   . Diabetes Brother   . Stroke Brother   . Hyperlipidemia Brother   . Aortic aneurysm Unknown   . Stroke Unknown   . Colon cancer Neg Hx     Past Medical History:  Diagnosis Date  . Agatston coronary artery calcium score less than 100 09/17/2012   4.4  lad 25%ile for age   . Allergic rhinitis   . Cataract   . Chronic back pain   . Diverticulosis   . Hypertension   . Ocular migraine    Consult - Sethi  hospitalized  . Osteoarthritis   .  Osteopenia   . Pulmonary nodule    [facility]  . Retinal tear    OS  . Sarcoidosis   . Scoliosis     Past Surgical History:  Procedure Laterality Date  . ABDOMINAL HYSTERECTOMY    . BACK SURGERY     x 2  . BILATERAL SALPINGOOPHORECTOMY    . CATARACT EXTRACTION  12/11    Current Outpatient Prescriptions  Medication Sig Dispense Refill  . amLODipine (NORVASC) 2.5 MG tablet Take 1 tablet (2.5 mg total) by mouth daily. 30 tablet 1  . Magnesium 250 MG TABS Take by mouth at bedtime.     . Multiple Vitamins-Minerals (MULTIVITAMIN PO) Take by mouth.    . Probiotic Product (PROBIOTIC PO) Take by mouth.     No current facility-administered medications for this visit.     Allergies as of 06/16/2017 - Review Complete 06/16/2017  Allergen Reaction Noted  . Lisinopril  01/27/2013  . Flu virus vaccine Other (See Comments) 10/15/2014    Vitals: BP 132/77   Pulse 95   Ht 4\' 11"  (1.499 m)   Wt 128 lb (58.1 kg)   BMI 25.85 kg/m  Last Weight:  Wt Readings from Last 1 Encounters:  06/16/17 128 lb (58.1 kg)   SVX:BLTJ mass index is 25.85 kg/m.     Last Height:   Ht Readings from Last 1 Encounters:  06/16/17 4\' 11"  (1.499 m)    Physical exam:  General: The patient is awake, alert and appears not in acute distress. The patient is well groomed. Head: Normocephalic, atraumatic. Neck is supple. Mallampati 3-4 neck circumference:15. Nasal airflow -congested, Retrognathia is not seen.  Cardiovascular:  Regular rate and rhythm  without  murmurs or carotid bruit, and without distended neck veins. Respiratory: Lungs are clear to auscultation. Skin:  Without evidence of edema, or rash Trunk: BMI is 26 The patient's posture is erect   Neurologic exam : The patient is awake and alert, oriented to place and time.   Attention span & concentration ability appears normal.  Speech is fluent,  without dysarthria, dysphonia or aphasia.  Mood and affect are appropriate.  Cranial  nerves: Pupils are equal and briskly reactive to light.  Funduscopic exam without evidence of pallor or edema.  Extraocular movements  in vertical and horizontal planes intact and without nystagmus. Visual fields by finger perimetry are intact. Hearing to finger rub intact.  Facial sensation intact to fine touch. Facial motor strength  is symmetric and tongue and uvula move midline. Shoulder shrug was symmetrical.   Motor exam: Normal tone, muscle bulk and symmetric strength in all extremities. Sensory:  Fine touch, pinprick and vibration were tested in all extremities. Proprioception tested in the upper extremities was normal. Coordination: Rapid alternating movements in the fingers/hands was normal. Finger-to-nose maneuver  normal without evidence of ataxia, dysmetria or tremor. Gait and station: Patient walks without assistive device . Strength within normal limits.  Stance is stable and normal.   Turns with 3  Steps. Romberg testing is negative.  Deep tendon reflexes: in the  upper and lower extremities are symmetric and intact. Babinski maneuver response is  downgoing.    Assessment:  After physical and neurologic examination, review of laboratory studies,  Personal review of imaging studies, reports of other /same  Imaging studies, results of polysomnography and / or neurophysiology testing and pre-existing records as far as provided in visit., my assessment is   1) I would agree with dr. Mathis Fare assessment that the patient is excessively daytime sleepy in spite of taking a power nap and getting 7-8 hours of nocturnal sleep. I do not see a significant amount of depression that would induce this kind of fatigue and sleepiness feeling. Since her daughter has  reported that she snores we should evaluate for sleep apnea. I will invite Mrs. Lafuente for sleep study to either confirm or rule out these suspicious diagnoses.  The patient was advised of the nature of the diagnosed  disorder , the treatment options and the  risks for general health and wellness arising from not treating the condition.   I spent more than 45 minutes of face to face time with the patient. Greater than 50% of time was spent in counseling and coordination of care. We have discussed the diagnosis and differential and I answered the patient's questions.    Plan:  Treatment plan and additional workup :  SPLIT night PSG , RV after test with MD.       Larey Seat, MD 60/60/0459, 9:77 PM  Certified in Neurology by ABPN Certified in [place] by [facility] [address] [place], [place] [address]

## 2017-06-16 NOTE — Patient Instructions (Signed)
Hypersomnia Hypersomnia is when you feel extremely tired during the day even though you're getting plenty of sleep at night. You may need to take naps during the day, and you may also be extremely difficult to wake up when you are sleeping. What are the causes? The cause of your hypersomnia may not be known. Hypersomnia may be caused by:  Medicines.  Sleep disorders, such as narcolepsy.  Trauma or injury to your head or nervous system.  Using drugs or alcohol.  Tumors.  Medical conditions, such as depression or hypothyroidism.  Genetics.  What are the signs or symptoms? The main symptoms of hypersomnia include:  Feeling extremely tired throughout the day.  Being very difficult to wake up.  Sleeping for longer and longer periods.  Taking naps throughout the day.  Other symptoms may include:  Feeling: ? Restless. ? Annoyed. ? Anxious. ? Low energy.  Having difficulty: ? Remembering. ? Speaking. ? Thinking.  Losing your appetite.  Experiencing hallucinations.  How is this diagnosed? Hypersomnia may be diagnosed by:  Medical history and physical exam. This will include a sleep history.  Completing sleep logs.  Tests may also be done, such as: ? Polysomnography. ? Multiple sleep latency test (MSLT).  How is this treated? There is no cure for hypersomnia, but treatment can be very effective in helping manage the condition. Treatment may include:  Lifestyle and sleeping strategies to help cope with the condition.  Stimulant medicines.  Treating any underlying causes of hypersomnia.  Follow these instructions at home:  Take medicines only as directed by your health care provider.  Schedule short naps for when you feel sleepiest during the day. Tell your employer or teachers that you have hypersomnia. You may be able to adjust your schedule to include time for naps.  Avoid drinking alcohol or caffeinated beverages.  Do not eat a heavy meal before  bedtime. Eat at about the same times every day.  Do not drive or operate heavy machinery if you are sleepy.  Do not swim or go out on the water without a life jacket.  If possible, adjust your schedule so that you do not have to work or be active at night.  Keep all follow-up visits as directed by your health care provider. This is important. Contact a health care provider if:  You have new symptoms.  Your symptoms get worse. Get help right away if: You have serious thoughts of hurting yourself or someone else. This information is not intended to replace advice given to you by your health care provider. Make sure you discuss any questions you have with your health care provider. Document Released: 08/14/2002 Document Revised: 01/30/2016 Document Reviewed: 03/29/2014 Elsevier Interactive Patient Education  2018 [employer]  

## 2017-07-23 DIAGNOSIS — R42 Dizziness and giddiness: Secondary | ICD-10-CM | POA: Diagnosis not present

## 2017-07-23 DIAGNOSIS — R5382 Chronic fatigue, unspecified: Secondary | ICD-10-CM | POA: Diagnosis not present

## 2017-07-23 DIAGNOSIS — I10 Essential (primary) hypertension: Secondary | ICD-10-CM | POA: Diagnosis not present

## 2017-09-06 DIAGNOSIS — I10 Essential (primary) hypertension: Secondary | ICD-10-CM | POA: Diagnosis not present

## 2017-09-06 DIAGNOSIS — H6123 Impacted cerumen, bilateral: Secondary | ICD-10-CM | POA: Diagnosis not present

## 2017-09-06 DIAGNOSIS — R5383 Other fatigue: Secondary | ICD-10-CM | POA: Diagnosis not present

## 2017-09-06 DIAGNOSIS — R42 Dizziness and giddiness: Secondary | ICD-10-CM | POA: Diagnosis not present

## 2017-09-13 ENCOUNTER — Telehealth: Payer: Self-pay | Admitting: Neurology

## 2017-09-13 ENCOUNTER — Other Ambulatory Visit: Payer: Self-pay | Admitting: Neurology

## 2017-09-13 DIAGNOSIS — G4733 Obstructive sleep apnea (adult) (pediatric): Secondary | ICD-10-CM

## 2017-09-13 NOTE — Telephone Encounter (Signed)
Pt called she said at the last OV 06/16/17 Dr D had discussed her having sleep study but she has not rec'd a call. It does not look like a referral was put in. Please call to advise

## 2017-09-13 NOTE — Telephone Encounter (Signed)
Appears pt was seen in oct and Dr Dohmeier recommended doing a sleep study (split night) I do not see where this was ordered. I have placed order for MD and will sent to the sleep lab to see if patient can be scheduled.

## 2017-09-13 NOTE — Telephone Encounter (Signed)
Called the patient and made her aware that I did place these orders and that it was an oversight on our part. I informed the patient that I have placed the order and that I contacted the sleep lab and made them aware so that they could be on a lookout for her name. Soon as they get approval from insurance they will contact her and get her set up. Pt verbalized understanding and was appreciative for the update

## 2017-10-18 ENCOUNTER — Ambulatory Visit (INDEPENDENT_AMBULATORY_CARE_PROVIDER_SITE_OTHER): Payer: Medicare Other | Admitting: Neurology

## 2017-10-18 DIAGNOSIS — G4733 Obstructive sleep apnea (adult) (pediatric): Secondary | ICD-10-CM | POA: Diagnosis not present

## 2017-10-25 ENCOUNTER — Telehealth: Payer: Self-pay | Admitting: Neurology

## 2017-10-25 NOTE — Procedures (Signed)
PATIENT'S NAME:  Kristin Jimenez, Kristin Jimenez DOB:      [birth year]      MR#:    [id number]     DATE OF RECORDING: 10/18/2017 REFERRING M.D.:  Shanon Ace, M.D. Study Performed:   Baseline Polysomnogram HISTORY:  This 84 y.o. slender Female patient is seen here in a referral from Dr. Regis Bill for a sleep evaluation.  Chief complaint according to patient:" I may snore"," I am tired and anxious".  She may take a 20 minute power nap after lunch, wakes up refreshed. Her daughter witnessed her to snore, the patient has not woken up from snoring.  Overall she feels that her sleep is good. She is more fatigued than sleepy and she feels BORED.    The patient endorsed the Epworth Sleepiness Scale at 7 points.   The patient's weight 128 pounds with a height of 59 (inches), resulting in a BMI of 25.8 kg/m2. The patient's neck circumference measured 15 inches.  CURRENT MEDICATIONS: Norvasc, Magnesium, Multivitamin, Probiotic   PROCEDURE:  This is a multichannel digital polysomnogram utilizing the Somnostar 11.2 system.  Electrodes and sensors were applied and monitored per AASM Specifications.   EEG, EOG, Chin and Limb EMG, were sampled at 200 Hz.  ECG, Snore and Nasal Pressure, Thermal Airflow, Respiratory Effort, CPAP Flow and Pressure, Oximetry was sampled at 50 Hz. Digital video and audio were recorded.      BASELINE STUDY Lights Out was at 21:02 and Lights On at 05:02.  Total recording time (TRT) was 480.5 minutes, with a total sleep time (TST) of 371.5 minutes.   The patient's sleep latency was 88 minutes.  REM latency was 99 minutes.  The sleep efficiency was 77.3 %.     SLEEP ARCHITECTURE: WASO (Wake after sleep onset) was 21 minutes.  There were 14.5 minutes in Stage N1, 181 minutes Stage N2, 123.5 minutes Stage N3 and 52.5 minutes in Stage REM.  The percentage of Stage N1 was 3.9%, Stage N2 was 48.7%, Stage N3 was 33.2% and Stage R (REM sleep) was 14.1%.   The arousals were noted as: 30 were spontaneous, 0  were associated with PLMs, and 0 were associated with respiratory events. Audio and video analysis did not show any abnormal or unusual movements, behaviors, phonations or vocalizations.  Very mild, non-continuous snoring was noted.  EKG was in keeping with normal sinus rhythm (NSR).  RESPIRATORY ANALYSIS:  There were a total of 0 respiratory events:  0 obstructive apneas, 0 central apneas and 0 mixed apneas with a total of 0 apneas and an apnea index (AI) of 0 /hour. There were 0 hypopneas with a hypopnea index of 0 /hour. The patient also had 0 respiratory event related arousals (RERAs).      The total APNEA/HYPOPNEA INDEX (AHI) was 0/hour and the total RESPIRATORY DISTURBANCE INDEX was 0 /hour.  The patient only slept non-supine.  OXYGEN SATURATION & C02:  The Wake baseline 02 saturation was 98%, with the lowest being 92%. Time spent below 89% saturation equaled 0 minutes.   PERIODIC LIMB MOVEMENTS:   The patient had a total of 0 Periodic Limb Movements.  The Periodic Limb Movement (PLM) index was 0 and the PLM Arousal index was 0/hour.  Post-study, the patient indicated that sleep was briefer than usual.    IMPRESSION: Mildly fragmented sleep.   RECOMMENDATIONS: No intervention necessary.  1. A follow up appointment will be scheduled in the Sleep Clinic at [facility]. The referring provider will be notified of the  results.      I certify that I have reviewed the entire raw data recording prior to the issuance of this report in accordance with the Standards of Accreditation of the American Academy of Sleep Medicine (AASM)    Larey Seat, MD   10-25-2017  Diplomat, American Board of Psychiatry and Neurology  Diplomat, American Board of [place] Director, [employer] [employer]

## 2017-10-25 NOTE — Telephone Encounter (Signed)
Called to discuss the sleep study with the patient. I made her aware that the study was normal and didn't show any evidence of apnea or low oxygen. .Pt verbalized understanding. Pt had no questions at this time but was encouraged to call back if questions arise.

## 2017-10-25 NOTE — Telephone Encounter (Signed)
-----   Message from Larey Seat, MD sent at 10/25/2017  8:42 AM EST ----- No evidence of apnea, mildest snoring, all sleep in non-supine. NO INTERVENTION needed. May improve sleep continuum by taking melatonin. CD

## 2017-11-09 DIAGNOSIS — M5136 Other intervertebral disc degeneration, lumbar region: Secondary | ICD-10-CM | POA: Diagnosis not present

## 2017-11-09 DIAGNOSIS — M1991 Primary osteoarthritis, unspecified site: Secondary | ICD-10-CM | POA: Diagnosis not present

## 2017-11-09 DIAGNOSIS — G4733 Obstructive sleep apnea (adult) (pediatric): Secondary | ICD-10-CM | POA: Diagnosis not present

## 2017-11-09 DIAGNOSIS — N390 Urinary tract infection, site not specified: Secondary | ICD-10-CM | POA: Diagnosis not present

## 2017-11-09 DIAGNOSIS — R5383 Other fatigue: Secondary | ICD-10-CM | POA: Diagnosis not present

## 2017-11-09 DIAGNOSIS — I10 Essential (primary) hypertension: Secondary | ICD-10-CM | POA: Diagnosis not present

## 2017-12-14 DIAGNOSIS — I10 Essential (primary) hypertension: Secondary | ICD-10-CM | POA: Diagnosis not present

## 2018-01-12 DIAGNOSIS — I10 Essential (primary) hypertension: Secondary | ICD-10-CM | POA: Diagnosis not present

## 2018-01-12 DIAGNOSIS — M1991 Primary osteoarthritis, unspecified site: Secondary | ICD-10-CM | POA: Diagnosis not present

## 2018-01-12 DIAGNOSIS — M5136 Other intervertebral disc degeneration, lumbar region: Secondary | ICD-10-CM | POA: Diagnosis not present

## 2018-01-12 DIAGNOSIS — G4733 Obstructive sleep apnea (adult) (pediatric): Secondary | ICD-10-CM | POA: Diagnosis not present

## 2018-01-26 DIAGNOSIS — M1991 Primary osteoarthritis, unspecified site: Secondary | ICD-10-CM | POA: Diagnosis not present

## 2018-01-26 DIAGNOSIS — I10 Essential (primary) hypertension: Secondary | ICD-10-CM | POA: Diagnosis not present

## 2018-03-04 DIAGNOSIS — R42 Dizziness and giddiness: Secondary | ICD-10-CM | POA: Diagnosis not present

## 2018-03-04 DIAGNOSIS — M419 Scoliosis, unspecified: Secondary | ICD-10-CM | POA: Diagnosis not present

## 2018-03-04 DIAGNOSIS — M8580 Other specified disorders of bone density and structure, unspecified site: Secondary | ICD-10-CM | POA: Diagnosis not present

## 2018-03-04 DIAGNOSIS — Z131 Encounter for screening for diabetes mellitus: Secondary | ICD-10-CM | POA: Diagnosis not present

## 2018-03-04 DIAGNOSIS — R5383 Other fatigue: Secondary | ICD-10-CM | POA: Diagnosis not present

## 2018-03-04 DIAGNOSIS — Z0000 Encounter for general adult medical examination without abnormal findings: Secondary | ICD-10-CM | POA: Diagnosis not present

## 2018-03-04 DIAGNOSIS — M545 Low back pain: Secondary | ICD-10-CM | POA: Diagnosis not present

## 2018-03-04 DIAGNOSIS — R0683 Snoring: Secondary | ICD-10-CM | POA: Diagnosis not present

## 2018-03-04 DIAGNOSIS — Z1211 Encounter for screening for malignant neoplasm of colon: Secondary | ICD-10-CM | POA: Diagnosis not present

## 2018-03-04 DIAGNOSIS — I10 Essential (primary) hypertension: Secondary | ICD-10-CM | POA: Diagnosis not present

## 2018-03-04 DIAGNOSIS — Z9889 Other specified postprocedural states: Secondary | ICD-10-CM | POA: Diagnosis not present

## 2018-03-04 DIAGNOSIS — M47816 Spondylosis without myelopathy or radiculopathy, lumbar region: Secondary | ICD-10-CM | POA: Diagnosis not present

## 2018-03-08 DIAGNOSIS — M419 Scoliosis, unspecified: Secondary | ICD-10-CM | POA: Diagnosis not present

## 2018-03-24 DIAGNOSIS — Z961 Presence of intraocular lens: Secondary | ICD-10-CM | POA: Diagnosis not present

## 2018-04-14 DIAGNOSIS — K6389 Other specified diseases of intestine: Secondary | ICD-10-CM | POA: Diagnosis not present

## 2018-04-14 DIAGNOSIS — D120 Benign neoplasm of cecum: Secondary | ICD-10-CM | POA: Diagnosis not present

## 2018-04-14 DIAGNOSIS — R195 Other fecal abnormalities: Secondary | ICD-10-CM | POA: Diagnosis not present

## 2018-04-14 DIAGNOSIS — I10 Essential (primary) hypertension: Secondary | ICD-10-CM | POA: Diagnosis not present

## 2018-05-02 DIAGNOSIS — H6123 Impacted cerumen, bilateral: Secondary | ICD-10-CM | POA: Diagnosis not present

## 2018-05-30 ENCOUNTER — Other Ambulatory Visit: Payer: Self-pay | Admitting: Internal Medicine

## 2018-05-30 DIAGNOSIS — Z1231 Encounter for screening mammogram for malignant neoplasm of breast: Secondary | ICD-10-CM

## 2018-05-31 ENCOUNTER — Ambulatory Visit: Payer: Medicare Other

## 2018-06-02 DIAGNOSIS — D1801 Hemangioma of skin and subcutaneous tissue: Secondary | ICD-10-CM | POA: Diagnosis not present

## 2018-06-02 DIAGNOSIS — L820 Inflamed seborrheic keratosis: Secondary | ICD-10-CM | POA: Diagnosis not present

## 2018-06-02 DIAGNOSIS — L80 Vitiligo: Secondary | ICD-10-CM | POA: Diagnosis not present

## 2018-06-02 DIAGNOSIS — D692 Other nonthrombocytopenic purpura: Secondary | ICD-10-CM | POA: Diagnosis not present

## 2018-06-02 DIAGNOSIS — L821 Other seborrheic keratosis: Secondary | ICD-10-CM | POA: Diagnosis not present

## 2018-06-02 DIAGNOSIS — D225 Melanocytic nevi of trunk: Secondary | ICD-10-CM | POA: Diagnosis not present

## 2018-06-16 DIAGNOSIS — M25774 Osteophyte, right foot: Secondary | ICD-10-CM | POA: Diagnosis not present

## 2018-06-16 DIAGNOSIS — M25775 Osteophyte, left foot: Secondary | ICD-10-CM | POA: Diagnosis not present

## 2018-07-06 ENCOUNTER — Ambulatory Visit: Payer: Medicare Other | Admitting: Podiatry

## 2018-08-18 DIAGNOSIS — R5383 Other fatigue: Secondary | ICD-10-CM | POA: Diagnosis not present

## 2018-08-18 DIAGNOSIS — M1991 Primary osteoarthritis, unspecified site: Secondary | ICD-10-CM | POA: Diagnosis not present

## 2018-08-18 DIAGNOSIS — F419 Anxiety disorder, unspecified: Secondary | ICD-10-CM | POA: Diagnosis not present

## 2018-08-18 DIAGNOSIS — R0602 Shortness of breath: Secondary | ICD-10-CM | POA: Diagnosis not present

## 2018-09-13 DIAGNOSIS — Z8249 Family history of ischemic heart disease and other diseases of the circulatory system: Secondary | ICD-10-CM | POA: Diagnosis not present

## 2018-09-13 DIAGNOSIS — R5383 Other fatigue: Secondary | ICD-10-CM | POA: Diagnosis not present

## 2018-09-13 DIAGNOSIS — R079 Chest pain, unspecified: Secondary | ICD-10-CM | POA: Diagnosis not present

## 2018-09-13 DIAGNOSIS — Z1231 Encounter for screening mammogram for malignant neoplasm of breast: Secondary | ICD-10-CM | POA: Diagnosis not present

## 2018-09-20 ENCOUNTER — Ambulatory Visit
Admission: RE | Admit: 2018-09-20 | Discharge: 2018-09-20 | Disposition: A | Discharge status: Home / Self Care | Payer: Medicare Other | Source: Ambulatory Visit | Attending: Internal Medicine | Admitting: Internal Medicine

## 2018-09-20 DIAGNOSIS — Z1231 Encounter for screening mammogram for malignant neoplasm of breast: Secondary | ICD-10-CM | POA: Diagnosis not present

## 2018-10-30 ENCOUNTER — Emergency Department (HOSPITAL_COMMUNITY): Payer: Medicare Other

## 2018-10-30 ENCOUNTER — Encounter (HOSPITAL_COMMUNITY): Payer: Self-pay | Admitting: Emergency Medicine

## 2018-10-30 ENCOUNTER — Emergency Department (HOSPITAL_COMMUNITY)
Admission: EM | Admit: 2018-10-30 | Discharge: 2018-10-30 | Disposition: A | Discharge status: Home / Self Care | Payer: Medicare Other | Attending: Emergency Medicine | Admitting: Emergency Medicine

## 2018-10-30 ENCOUNTER — Other Ambulatory Visit: Payer: Self-pay

## 2018-10-30 ENCOUNTER — Ambulatory Visit (HOSPITAL_COMMUNITY)
Admission: EM | Admit: 2018-10-30 | Discharge: 2018-10-30 | Disposition: A | Discharge status: Nursing Home (Includes ICF) | Payer: Medicare Other | Source: Home / Self Care

## 2018-10-30 DIAGNOSIS — R42 Dizziness and giddiness: Secondary | ICD-10-CM | POA: Insufficient documentation

## 2018-10-30 DIAGNOSIS — I10 Essential (primary) hypertension: Secondary | ICD-10-CM | POA: Diagnosis not present

## 2018-10-30 DIAGNOSIS — Z79899 Other long term (current) drug therapy: Secondary | ICD-10-CM | POA: Insufficient documentation

## 2018-10-30 DIAGNOSIS — R410 Disorientation, unspecified: Secondary | ICD-10-CM | POA: Diagnosis not present

## 2018-10-30 LAB — URINALYSIS, ROUTINE W REFLEX MICROSCOPIC
Bilirubin Urine: NEGATIVE
Glucose, UA: NEGATIVE mg/dL
Hgb urine dipstick: NEGATIVE
Ketones, ur: NEGATIVE mg/dL
Nitrite: NEGATIVE
Protein, ur: 30 mg/dL — AB
Specific Gravity, Urine: 1.011 (ref 1.005–1.030)
pH: 7 (ref 5.0–8.0)

## 2018-10-30 LAB — CBC
HCT: 39.2 % (ref 36.0–46.0)
HEMOGLOBIN: 12.9 g/dL (ref 12.0–15.0)
MCH: 30.2 pg (ref 26.0–34.0)
MCHC: 32.9 g/dL (ref 30.0–36.0)
MCV: 91.8 fL (ref 80.0–100.0)
PLATELETS: 227 10*3/uL (ref 150–400)
RBC: 4.27 MIL/uL (ref 3.87–5.11)
RDW: 12.6 % (ref 11.5–15.5)
WBC: 11 10*3/uL — AB (ref 4.0–10.5)
nRBC: 0 % (ref 0.0–0.2)

## 2018-10-30 LAB — BASIC METABOLIC PANEL
ANION GAP: 10 (ref 5–15)
BUN: 11 mg/dL (ref 8–23)
CALCIUM: 9.3 mg/dL (ref 8.9–10.3)
CO2: 21 mmol/L — ABNORMAL LOW (ref 22–32)
Chloride: 97 mmol/L — ABNORMAL LOW (ref 98–111)
Creatinine, Ser: 0.64 mg/dL (ref 0.44–1.00)
GFR calc Af Amer: >60 mL/min (ref >60)
Glucose, Bld: 125 mg/dL — ABNORMAL HIGH (ref 70–99)
POTASSIUM: 4 mmol/L (ref 3.5–5.1)
SODIUM: 128 mmol/L — AB (ref 135–145)

## 2018-10-30 LAB — MAGNESIUM: Magnesium: 1.9 mg/dL (ref 1.7–2.4)

## 2018-10-30 MED ORDER — SODIUM CHLORIDE 0.9 % IV BOLUS
1000.0000 mL | Freq: Once | INTRAVENOUS | Status: AC
  Administered 2018-10-30: 1000 mL via INTRAVENOUS

## 2018-10-30 NOTE — ED Notes (Signed)
Patient deferred to the [facility] ED per Dr. Lanny Cramp.

## 2018-10-30 NOTE — Discharge Instructions (Addendum)
Return here as needed.  Follow-up with your doctor soon as possible. °

## 2018-10-30 NOTE — ED Notes (Signed)
Pt transported to CT ?

## 2018-10-30 NOTE — ED Notes (Signed)
Pt ambulated to bathroom, with slow and steady gait.

## 2018-10-30 NOTE — ED Triage Notes (Signed)
The patient presented to the [facility] with a complaint HTN and "lightheadedness" that started this am. The patient reported that she took her medications last night and took her BP this am and it was 229/110.

## 2018-10-30 NOTE — ED Provider Notes (Signed)
[facility] EMERGENCY DEPARTMENT Provider Note   CSN: [id number] Arrival date & time: 10/30/18  1335    History   Chief Complaint Chief Complaint  Patient presents with  . Hypertension    HPI Kristin Jimenez is a 85 y.o. female.     HPI Patient presents to the emergency department with elevated blood pressure and sent here by the urgent care center.  The patient that this morning checked her blood pressure and it was elevated and she did feel some slight dizziness.  She states she did not have any other symptoms and feels better at this time.  The patient denies chest pain, shortness of breath, headache,blurred vision, neck pain, fever, cough, weakness, numbness, anorexia, edema, abdominal pain, nausea, vomiting, diarrhea, rash, back pain, dysuria, hematemesis, bloody stool, near syncope, or syncope. Past Medical History:  Diagnosis Date  . Agatston coronary artery calcium score less than 100 09/17/2012   4.4  lad 25%ile for age   . Allergic rhinitis   . Cataract   . Chronic back pain   . Diverticulosis   . Hypertension   . Ocular migraine    Consult - [facility]  hospitalized  . Osteoarthritis   . Osteopenia   . Pulmonary nodule    [facility]en [facility] following  . Retinal tear    OS  . Sarcoidosis   . Scoliosis     Patient Active Problem List   Diagnosis Date Noted  . Excessive daytime sleepiness 06/16/2017  . Snoring 06/16/2017  . Chronic throat clearing 07/18/2015  . Persistent cough for 3 weeks or longer 07/18/2015  . Vertigo 02/13/2014  . Dizziness 01/05/2014  . Medication side effect 11/08/2013  . Tired 01/29/2013  . Agatston coronary artery calcium score less than 100 09/17/2012  . Medicare annual wellness visit, initial 07/21/2011  . Vitamin B12 nutritional deficiency 07/21/2011  . Pulmonary nodule   . Anxiety 04/20/2011  . Change in bowel function 04/20/2011  . SINUS BRADYCARDIA 06/24/2010  . MIGRAINE, OPHTHALMIC 05/02/2010  . VERTIGO  05/02/2010  . FATIGUE 05/02/2010  . UNSPECIFIED PERIPHERAL VERTIGO 02/08/2010  . CERUMEN IMPACTION 10/21/2009  . DECREASED HEARING, LEFT EAR 10/21/2009  . SHOULDER PAIN, LEFT 02/15/2009  . ARM PAIN, LEFT 02/15/2009  . CONSTIPATION 07/10/2008  . MIGRAINE WITH AURA 11/01/2007  . MIXED DISORDERS AS REACTION TO STRESS 09/20/2007  . INSOMNIA 05/19/2007  . Unspecified essential hypertension 03/22/2007  . ALLERGIC RHINITIS 03/22/2007  . OSTEOARTHRITIS 03/22/2007  . OSTEOPENIA 03/22/2007    Past Surgical History:  Procedure Laterality Date  . ABDOMINAL HYSTERECTOMY    . BACK SURGERY     x 2  . BILATERAL SALPINGOOPHORECTOMY    . CATARACT EXTRACTION  12/11     OB History   No obstetric history on file.      Home Medications    Prior to Admission medications   Medication Sig Start Date End Date Taking? Authorizing Provider  amLODipine (NORVASC) 2.5 MG tablet Take 1 tablet (2.5 mg total) by mouth daily. 02/25/16   Panosh, Standley Brooking, MD  losartan (COZAAR) 100 MG tablet Take 100 mg by mouth daily.    [provider]  Magnesium 250 MG TABS Take by mouth at bedtime.     [provider]  Multiple Vitamins-Minerals (MULTIVITAMIN PO) Take by mouth.    [provider]  Probiotic Product (PROBIOTIC PO) Take by mouth.    [provider]    Family History Family History  Problem Relation Age of  Onset  . Heart disease Mother   . Lung cancer Father   . Arthritis Father   . Hyperlipidemia Sister   . Arthritis Sister   . Diabetes Brother   . Stroke Brother   . Hyperlipidemia Brother   . Aortic aneurysm Other   . Stroke Other   . Colon cancer Neg Hx     Social History Social History   Tobacco Use  . Smoking status: Never Smoker  . Smokeless tobacco: Never Used  Substance Use Topics  . Alcohol use: Yes    Comment: Wine, red or white every night  . Drug use: No     Allergies   Lisinopril and Flu virus vaccine   Review of Systems Review  of Systems All other systems negative except as documented in the HPI. All pertinent positives and negatives as reviewed in the HPI.  Physical Exam Updated Vital Signs BP (!) 167/99   Pulse 95   Temp (!) 97.5 F (36.4 C) (Oral)   Resp 17   Ht 4\' 11"  (1.499 m)   Wt 56.7 kg   SpO2 98%   BMI 25.25 kg/m   Physical Exam Vitals signs and nursing note reviewed.  Constitutional:      General: She is not in acute distress.    Appearance: She is well-developed.  HENT:     Head: Normocephalic and atraumatic.  Eyes:     Pupils: Pupils are equal, round, and reactive to light.  Neck:     Musculoskeletal: Normal range of motion and neck supple.  Cardiovascular:     Rate and Rhythm: Normal rate and regular rhythm.     Heart sounds: Normal heart sounds. No murmur. No friction rub. No gallop.   Pulmonary:     Effort: Pulmonary effort is normal. No respiratory distress.     Breath sounds: Normal breath sounds. No wheezing.  Abdominal:     General: Bowel sounds are normal. There is no distension.     Palpations: Abdomen is soft.     Tenderness: There is no abdominal tenderness.  Skin:    General: Skin is warm and dry.     Capillary Refill: Capillary refill takes less than 2 seconds.     Findings: No erythema or rash.  Neurological:     General: No focal deficit present.     Mental Status: She is alert and oriented to person, place, and time. Mental status is at baseline.     Cranial Nerves: No cranial nerve deficit.     Sensory: No sensory deficit.     Motor: No weakness or abnormal muscle tone.     Coordination: Coordination normal.     Gait: Gait normal.     Deep Tendon Reflexes: Reflexes normal.  Psychiatric:        Behavior: Behavior normal.      ED Treatments / Results  Labs (all labs ordered are listed, but only abnormal results are displayed) Labs Reviewed  BASIC METABOLIC PANEL - Abnormal; Notable for the following components:      Result Value   Sodium 128 (*)     Chloride 97 (*)    CO2 21 (*)    Glucose, Bld 125 (*)    All other components within normal limits  CBC - Abnormal; Notable for the following components:   WBC 11.0 (*)    All other components within normal limits  URINALYSIS, ROUTINE W REFLEX MICROSCOPIC - Abnormal; Notable for the following components:   Color, Urine  STRAW (*)    Protein, ur 30 (*)    Leukocytes,Ua TRACE (*)    Bacteria, UA RARE (*)    All other components within normal limits  MAGNESIUM    EKG None  Radiology Ct Head Wo Contrast  Result Date: 10/30/2018 CLINICAL DATA:  Mild dizziness and confusion. Hypertension. Hyponatremia. No reported injury. EXAM: CT HEAD WITHOUT CONTRAST TECHNIQUE: Contiguous axial images were obtained from the base of the skull through the vertex without intravenous contrast. COMPARISON:  01/14/2017 head CT. FINDINGS: Brain: No evidence of parenchymal hemorrhage or extra-axial fluid collection. No mass lesion, mass effect, or midline shift. No CT evidence of acute infarction. Nonspecific moderate subcortical and periventricular white matter hypodensity, most in keeping with chronic small vessel ischemic change. Generalized cerebral volume loss. No ventriculomegaly. Vascular: No acute abnormality. Skull: No evidence of calvarial fracture. Sinuses/Orbits: The visualized paranasal sinuses are essentially clear. Other:  The mastoid air cells are unopacified. IMPRESSION: 1. No evidence of acute intracranial abnormality. 2. Generalized cerebral volume loss and moderate chronic small vessel ischemic changes in the cerebral white matter. Electronically Signed   By: Ilona Sorrel M.D.   On: 10/30/2018 17:08    Procedures Procedures (including critical care time)  Medications Ordered in ED Medications  sodium chloride 0.9 % bolus 1,000 mL (0 mLs Intravenous Stopped 10/30/18 1705)     Initial Impression / Assessment and Plan / ED Course  I have reviewed the triage vital signs and the nursing  notes.  Pertinent labs & imaging results that were available during my care of the patient were reviewed by me and considered in my medical decision making (see chart for details).        Plan I do not feel the patient is in hypertensive crisis or urgency.  She does need to follow-up with her doctor soon as possible for reevaluation of her blood pressures.  Advised her to call them for an appointment as soon as possible.  I also advised the patient on return precautions to the emergency department.  Patient has no focal neurological deficits and she is able to ambulate without any symptoms or difficulty at this time.  Final Clinical Impressions(s) / ED Diagnoses   Final diagnoses:  Essential hypertension    ED Discharge Orders    None       Rebeca Allegra 10/30/18 2355    Duffy Bruce, MD 11/02/18 0010

## 2018-10-30 NOTE — ED Triage Notes (Signed)
Pt. Stated, I have high BP. I was at [facility] and they sent [place] over here.

## 2018-10-31 DIAGNOSIS — M1991 Primary osteoarthritis, unspecified site: Secondary | ICD-10-CM | POA: Diagnosis not present

## 2018-10-31 DIAGNOSIS — R0602 Shortness of breath: Secondary | ICD-10-CM | POA: Diagnosis not present

## 2018-10-31 DIAGNOSIS — I10 Essential (primary) hypertension: Secondary | ICD-10-CM | POA: Diagnosis not present

## 2018-10-31 DIAGNOSIS — F419 Anxiety disorder, unspecified: Secondary | ICD-10-CM | POA: Diagnosis not present

## 2018-10-31 DIAGNOSIS — R030 Elevated blood-pressure reading, without diagnosis of hypertension: Secondary | ICD-10-CM | POA: Diagnosis not present

## 2018-10-31 DIAGNOSIS — Z0000 Encounter for general adult medical examination without abnormal findings: Secondary | ICD-10-CM | POA: Diagnosis not present

## 2018-10-31 DIAGNOSIS — R5383 Other fatigue: Secondary | ICD-10-CM | POA: Diagnosis not present

## 2018-11-07 DIAGNOSIS — R5383 Other fatigue: Secondary | ICD-10-CM | POA: Diagnosis not present

## 2018-11-07 DIAGNOSIS — G43109 Migraine with aura, not intractable, without status migrainosus: Secondary | ICD-10-CM | POA: Diagnosis not present

## 2018-11-07 DIAGNOSIS — M1991 Primary osteoarthritis, unspecified site: Secondary | ICD-10-CM | POA: Diagnosis not present

## 2018-11-07 DIAGNOSIS — I10 Essential (primary) hypertension: Secondary | ICD-10-CM | POA: Diagnosis not present

## 2018-11-07 DIAGNOSIS — G4733 Obstructive sleep apnea (adult) (pediatric): Secondary | ICD-10-CM | POA: Diagnosis not present

## 2018-11-07 DIAGNOSIS — M5136 Other intervertebral disc degeneration, lumbar region: Secondary | ICD-10-CM | POA: Diagnosis not present

## 2018-12-29 ENCOUNTER — Other Ambulatory Visit: Payer: Self-pay | Admitting: Internal Medicine

## 2018-12-29 ENCOUNTER — Other Ambulatory Visit: Payer: Self-pay

## 2018-12-29 ENCOUNTER — Ambulatory Visit
Admission: RE | Admit: 2018-12-29 | Discharge: 2018-12-29 | Disposition: A | Discharge status: Home / Self Care | Payer: Medicare Other | Source: Ambulatory Visit | Attending: Internal Medicine | Admitting: Internal Medicine

## 2018-12-29 DIAGNOSIS — I639 Cerebral infarction, unspecified: Secondary | ICD-10-CM | POA: Diagnosis not present

## 2018-12-29 DIAGNOSIS — R29898 Other symptoms and signs involving the musculoskeletal system: Secondary | ICD-10-CM | POA: Diagnosis not present

## 2019-01-03 DIAGNOSIS — R29898 Other symptoms and signs involving the musculoskeletal system: Secondary | ICD-10-CM | POA: Diagnosis not present

## 2019-01-05 DIAGNOSIS — I63233 Cerebral infarction due to unspecified occlusion or stenosis of bilateral carotid arteries: Secondary | ICD-10-CM | POA: Diagnosis not present

## 2019-01-05 DIAGNOSIS — I639 Cerebral infarction, unspecified: Secondary | ICD-10-CM | POA: Diagnosis not present

## 2019-01-05 DIAGNOSIS — R202 Paresthesia of skin: Secondary | ICD-10-CM | POA: Diagnosis not present

## 2019-01-05 DIAGNOSIS — I6389 Other cerebral infarction: Secondary | ICD-10-CM | POA: Diagnosis not present

## 2019-01-31 DIAGNOSIS — R29898 Other symptoms and signs involving the musculoskeletal system: Secondary | ICD-10-CM | POA: Diagnosis not present

## 2019-01-31 DIAGNOSIS — Z7189 Other specified counseling: Secondary | ICD-10-CM | POA: Diagnosis not present

## 2019-02-08 ENCOUNTER — Institutional Professional Consult (permissible substitution): Payer: Medicare Other | Admitting: Neurology

## 2019-02-17 ENCOUNTER — Encounter: Payer: Self-pay | Admitting: Podiatry

## 2019-02-17 ENCOUNTER — Ambulatory Visit (INDEPENDENT_AMBULATORY_CARE_PROVIDER_SITE_OTHER): Payer: Medicare Other

## 2019-02-17 ENCOUNTER — Other Ambulatory Visit: Payer: Self-pay

## 2019-02-17 ENCOUNTER — Ambulatory Visit (INDEPENDENT_AMBULATORY_CARE_PROVIDER_SITE_OTHER): Payer: Medicare Other | Admitting: Podiatry

## 2019-02-17 VITALS — Temp 98.1°F

## 2019-02-17 DIAGNOSIS — B351 Tinea unguium: Secondary | ICD-10-CM | POA: Diagnosis not present

## 2019-02-17 DIAGNOSIS — I639 Cerebral infarction, unspecified: Secondary | ICD-10-CM

## 2019-02-17 DIAGNOSIS — M779 Enthesopathy, unspecified: Secondary | ICD-10-CM

## 2019-02-18 NOTE — Progress Notes (Signed)
Subjective: 85 year old female presents the office today for concerns of bone spurs underneath her big toes.  She states that she went to see another doctor for her toenails and she was told that she has bone spurs and she needs to have surgery.  She was hesitant to do so she is not having any pain.  Her concern is of the nails are becoming thickened discolored.  She denies any redness or drainage or any swelling to the toenail sites. Denies any systemic complaints such as fevers, chills, nausea, vomiting. No acute changes since last appointment, and no other complaints at this time.   Objective: AAO x3, NAD DP/PT pulses palpable bilaterally, CRT less than 3 seconds Bilateral hallux toenails are hypertrophic, dystrophic with yellow-brown discoloration.  There is no pain of the nails.  On the right side the nail is loose from the underlying nail bed distally but from adhered proximally.  There is no pain to the nails or to the toes.  Mild decrease in range of motion dorsiflexion on the left side worse than right.  No open lesions or pre-ulcerative lesions.  No pain with calf compression, swelling, warmth, erythema  Assessment: Onychomycosis, bone spur  Plan: -All treatment options discussed with the patient including all alternatives, risks, complications.  -X-rays were obtained reviewed.  The right side there is a bone spur on the dorsal distal phalanx underlying the nail bed.  No evidence of acute fracture.  Arthritic changes present of the first MPJ as well as the IPJ. -We discussion regards to both conservative as well as surgical treatment options.  At this time her nails are already damaged.  I do not think that removal of the bone spurs would not change the nails at this point.  Also she is having no pain to the toes.  Because of this I would not recommend surgical intervention at this time.  However she would have to treat the toenails.  I debrided bilateral hallux toenails without any  complications of this for culture, pathology to [facility] labs.  -Patient encouraged to call the office with any questions, concerns, change in symptoms.   RTC after nail culture or sooner if needed  Trula Slade DPM

## 2019-02-22 ENCOUNTER — Ambulatory Visit: Payer: Medicare Other | Admitting: Podiatry

## 2019-03-02 DIAGNOSIS — D225 Melanocytic nevi of trunk: Secondary | ICD-10-CM | POA: Diagnosis not present

## 2019-03-02 DIAGNOSIS — L820 Inflamed seborrheic keratosis: Secondary | ICD-10-CM | POA: Diagnosis not present

## 2019-03-02 DIAGNOSIS — L821 Other seborrheic keratosis: Secondary | ICD-10-CM | POA: Diagnosis not present

## 2019-03-02 DIAGNOSIS — D1801 Hemangioma of skin and subcutaneous tissue: Secondary | ICD-10-CM | POA: Diagnosis not present

## 2019-03-03 NOTE — Progress Notes (Signed)
Called patient to call me back today and that I would be here till 2 pm and back Monday morning at 8 am. Kristin Jimenez

## 2019-03-06 ENCOUNTER — Telehealth: Payer: Self-pay | Admitting: *Deleted

## 2019-03-06 NOTE — Telephone Encounter (Signed)
The RX was called over to [place] and the patient wanted to have the RX shipped to [address] [place]. Lattie Haw

## 2019-03-20 DIAGNOSIS — Z0000 Encounter for general adult medical examination without abnormal findings: Secondary | ICD-10-CM | POA: Diagnosis not present

## 2019-03-20 DIAGNOSIS — I10 Essential (primary) hypertension: Secondary | ICD-10-CM | POA: Diagnosis not present

## 2019-03-21 DIAGNOSIS — G629 Polyneuropathy, unspecified: Secondary | ICD-10-CM | POA: Diagnosis not present

## 2019-03-21 DIAGNOSIS — M6281 Muscle weakness (generalized): Secondary | ICD-10-CM | POA: Diagnosis not present

## 2019-03-21 DIAGNOSIS — M79671 Pain in right foot: Secondary | ICD-10-CM | POA: Diagnosis not present

## 2019-03-21 DIAGNOSIS — G8194 Hemiplegia, unspecified affecting left nondominant side: Secondary | ICD-10-CM | POA: Diagnosis not present

## 2019-03-21 DIAGNOSIS — I10 Essential (primary) hypertension: Secondary | ICD-10-CM | POA: Diagnosis not present

## 2019-03-21 DIAGNOSIS — I63231 Cerebral infarction due to unspecified occlusion or stenosis of right carotid arteries: Secondary | ICD-10-CM | POA: Diagnosis not present

## 2019-03-21 DIAGNOSIS — I672 Cerebral atherosclerosis: Secondary | ICD-10-CM | POA: Diagnosis not present

## 2019-03-21 DIAGNOSIS — L80 Vitiligo: Secondary | ICD-10-CM | POA: Diagnosis not present

## 2019-03-21 DIAGNOSIS — L820 Inflamed seborrheic keratosis: Secondary | ICD-10-CM | POA: Diagnosis not present

## 2019-03-21 DIAGNOSIS — R270 Ataxia, unspecified: Secondary | ICD-10-CM | POA: Diagnosis not present

## 2019-03-21 DIAGNOSIS — M79672 Pain in left foot: Secondary | ICD-10-CM | POA: Diagnosis not present

## 2019-03-21 DIAGNOSIS — R200 Anesthesia of skin: Secondary | ICD-10-CM | POA: Diagnosis not present

## 2019-03-21 DIAGNOSIS — Z0000 Encounter for general adult medical examination without abnormal findings: Secondary | ICD-10-CM | POA: Diagnosis not present

## 2019-03-21 DIAGNOSIS — K5909 Other constipation: Secondary | ICD-10-CM | POA: Diagnosis not present

## 2019-03-21 DIAGNOSIS — R29818 Other symptoms and signs involving the nervous system: Secondary | ICD-10-CM | POA: Diagnosis not present

## 2019-03-21 DIAGNOSIS — I63411 Cerebral infarction due to embolism of right middle cerebral artery: Secondary | ICD-10-CM | POA: Diagnosis not present

## 2019-03-21 DIAGNOSIS — R4781 Slurred speech: Secondary | ICD-10-CM | POA: Diagnosis not present

## 2019-03-21 DIAGNOSIS — I639 Cerebral infarction, unspecified: Secondary | ICD-10-CM | POA: Diagnosis not present

## 2019-03-21 DIAGNOSIS — E871 Hypo-osmolality and hyponatremia: Secondary | ICD-10-CM | POA: Diagnosis not present

## 2019-03-22 DIAGNOSIS — M1990 Unspecified osteoarthritis, unspecified site: Secondary | ICD-10-CM | POA: Diagnosis not present

## 2019-03-22 DIAGNOSIS — B351 Tinea unguium: Secondary | ICD-10-CM | POA: Diagnosis not present

## 2019-03-23 DIAGNOSIS — M79642 Pain in left hand: Secondary | ICD-10-CM | POA: Diagnosis not present

## 2019-03-23 DIAGNOSIS — R29898 Other symptoms and signs involving the musculoskeletal system: Secondary | ICD-10-CM | POA: Diagnosis not present

## 2019-03-24 DIAGNOSIS — M205X1 Other deformities of toe(s) (acquired), right foot: Secondary | ICD-10-CM | POA: Diagnosis present

## 2019-03-24 DIAGNOSIS — I672 Cerebral atherosclerosis: Secondary | ICD-10-CM | POA: Diagnosis not present

## 2019-03-24 DIAGNOSIS — I639 Cerebral infarction, unspecified: Secondary | ICD-10-CM | POA: Diagnosis not present

## 2019-03-24 DIAGNOSIS — K449 Diaphragmatic hernia without obstruction or gangrene: Secondary | ICD-10-CM | POA: Diagnosis not present

## 2019-03-24 DIAGNOSIS — I6523 Occlusion and stenosis of bilateral carotid arteries: Secondary | ICD-10-CM | POA: Diagnosis present

## 2019-03-24 DIAGNOSIS — R29705 NIHSS score 5: Secondary | ICD-10-CM | POA: Diagnosis present

## 2019-03-24 DIAGNOSIS — R29818 Other symptoms and signs involving the nervous system: Secondary | ICD-10-CM | POA: Diagnosis not present

## 2019-03-24 DIAGNOSIS — E785 Hyperlipidemia, unspecified: Secondary | ICD-10-CM | POA: Diagnosis present

## 2019-03-24 DIAGNOSIS — J9811 Atelectasis: Secondary | ICD-10-CM | POA: Diagnosis not present

## 2019-03-24 DIAGNOSIS — I63231 Cerebral infarction due to unspecified occlusion or stenosis of right carotid arteries: Secondary | ICD-10-CM | POA: Diagnosis not present

## 2019-03-24 DIAGNOSIS — R918 Other nonspecific abnormal finding of lung field: Secondary | ICD-10-CM | POA: Diagnosis not present

## 2019-03-24 DIAGNOSIS — E871 Hypo-osmolality and hyponatremia: Secondary | ICD-10-CM | POA: Diagnosis present

## 2019-03-24 DIAGNOSIS — L80 Vitiligo: Secondary | ICD-10-CM | POA: Diagnosis present

## 2019-03-24 DIAGNOSIS — M19072 Primary osteoarthritis, left ankle and foot: Secondary | ICD-10-CM | POA: Diagnosis present

## 2019-03-24 DIAGNOSIS — M85842 Other specified disorders of bone density and structure, left hand: Secondary | ICD-10-CM | POA: Diagnosis present

## 2019-03-24 DIAGNOSIS — Q2546 Tortuous aortic arch: Secondary | ICD-10-CM | POA: Diagnosis not present

## 2019-03-24 DIAGNOSIS — I63511 Cerebral infarction due to unspecified occlusion or stenosis of right middle cerebral artery: Secondary | ICD-10-CM | POA: Diagnosis not present

## 2019-03-24 DIAGNOSIS — I4891 Unspecified atrial fibrillation: Secondary | ICD-10-CM | POA: Diagnosis not present

## 2019-03-24 DIAGNOSIS — R600 Localized edema: Secondary | ICD-10-CM | POA: Diagnosis not present

## 2019-03-24 DIAGNOSIS — M19071 Primary osteoarthritis, right ankle and foot: Secondary | ICD-10-CM | POA: Diagnosis present

## 2019-03-24 DIAGNOSIS — G8194 Hemiplegia, unspecified affecting left nondominant side: Secondary | ICD-10-CM | POA: Diagnosis present

## 2019-03-24 DIAGNOSIS — I63411 Cerebral infarction due to embolism of right middle cerebral artery: Secondary | ICD-10-CM | POA: Diagnosis present

## 2019-03-24 DIAGNOSIS — M1812 Unilateral primary osteoarthritis of first carpometacarpal joint, left hand: Secondary | ICD-10-CM | POA: Diagnosis present

## 2019-03-24 DIAGNOSIS — K5909 Other constipation: Secondary | ICD-10-CM | POA: Diagnosis present

## 2019-03-24 DIAGNOSIS — I10 Essential (primary) hypertension: Secondary | ICD-10-CM | POA: Diagnosis present

## 2019-03-24 DIAGNOSIS — R471 Dysarthria and anarthria: Secondary | ICD-10-CM | POA: Diagnosis present

## 2019-03-24 DIAGNOSIS — R200 Anesthesia of skin: Secondary | ICD-10-CM | POA: Diagnosis not present

## 2019-03-24 DIAGNOSIS — Z20828 Contact with and (suspected) exposure to other viral communicable diseases: Secondary | ICD-10-CM | POA: Diagnosis present

## 2019-03-24 DIAGNOSIS — L821 Other seborrheic keratosis: Secondary | ICD-10-CM | POA: Diagnosis present

## 2019-03-24 DIAGNOSIS — L820 Inflamed seborrheic keratosis: Secondary | ICD-10-CM | POA: Diagnosis present

## 2019-03-24 DIAGNOSIS — M8580 Other specified disorders of bone density and structure, unspecified site: Secondary | ICD-10-CM | POA: Diagnosis present

## 2019-03-24 DIAGNOSIS — R4781 Slurred speech: Secondary | ICD-10-CM | POA: Diagnosis not present

## 2019-03-27 MED ORDER — DOCUSATE SODIUM 283 MG RE ENEM
1.00 | ENEMA | RECTAL | Status: DC
Start: ? — End: 2019-03-27

## 2019-03-27 MED ORDER — GENERIC EXTERNAL MEDICATION
200.00 | Status: DC
Start: 2019-03-26 — End: 2019-03-27

## 2019-03-27 MED ORDER — MELATONIN 3 MG PO TABS
3.00 | ORAL_TABLET | ORAL | Status: DC
Start: 2019-03-26 — End: 2019-03-27

## 2019-03-27 MED ORDER — GENERIC EXTERNAL MEDICATION
Status: DC
Start: ? — End: 2019-03-27

## 2019-03-27 MED ORDER — ATORVASTATIN CALCIUM 40 MG PO TABS
40.00 | ORAL_TABLET | ORAL | Status: DC
Start: 2019-03-26 — End: 2019-03-27

## 2019-03-27 MED ORDER — ASPIRIN 81 MG PO CHEW
81.00 | CHEWABLE_TABLET | ORAL | Status: DC
Start: 2019-03-27 — End: 2019-03-27

## 2019-03-27 MED ORDER — HEPARIN SODIUM (PORCINE) 5000 UNIT/ML IJ SOLN
5000.00 | INTRAMUSCULAR | Status: DC
Start: 2019-03-27 — End: 2019-03-27

## 2019-03-27 MED ORDER — SALINE BACTERIOSTATIC 0.9 % IJ SOLN
30.00 | INTRAMUSCULAR | Status: DC
Start: ? — End: 2019-03-27

## 2019-03-27 MED ORDER — CLOPIDOGREL BISULFATE 75 MG PO TABS
75.00 | ORAL_TABLET | ORAL | Status: DC
Start: 2019-03-27 — End: 2019-03-27

## 2019-04-05 DIAGNOSIS — Z8673 Personal history of transient ischemic attack (TIA), and cerebral infarction without residual deficits: Secondary | ICD-10-CM | POA: Diagnosis not present

## 2019-04-06 DIAGNOSIS — I69398 Other sequelae of cerebral infarction: Secondary | ICD-10-CM | POA: Diagnosis not present

## 2019-04-06 DIAGNOSIS — Z136 Encounter for screening for cardiovascular disorders: Secondary | ICD-10-CM | POA: Diagnosis not present

## 2019-04-06 DIAGNOSIS — I639 Cerebral infarction, unspecified: Secondary | ICD-10-CM | POA: Diagnosis not present

## 2019-04-14 DIAGNOSIS — L821 Other seborrheic keratosis: Secondary | ICD-10-CM | POA: Diagnosis not present

## 2019-04-14 DIAGNOSIS — L820 Inflamed seborrheic keratosis: Secondary | ICD-10-CM | POA: Diagnosis not present

## 2019-04-14 DIAGNOSIS — Z8673 Personal history of transient ischemic attack (TIA), and cerebral infarction without residual deficits: Secondary | ICD-10-CM | POA: Diagnosis not present

## 2019-04-14 DIAGNOSIS — L818 Other specified disorders of pigmentation: Secondary | ICD-10-CM | POA: Diagnosis not present

## 2019-04-14 DIAGNOSIS — L80 Vitiligo: Secondary | ICD-10-CM | POA: Diagnosis not present

## 2019-04-17 DIAGNOSIS — I639 Cerebral infarction, unspecified: Secondary | ICD-10-CM | POA: Diagnosis not present

## 2019-04-17 DIAGNOSIS — Z8673 Personal history of transient ischemic attack (TIA), and cerebral infarction without residual deficits: Secondary | ICD-10-CM | POA: Diagnosis not present

## 2019-04-17 DIAGNOSIS — I10 Essential (primary) hypertension: Secondary | ICD-10-CM | POA: Diagnosis not present

## 2019-04-17 DIAGNOSIS — Z0184 Encounter for antibody response examination: Secondary | ICD-10-CM | POA: Diagnosis not present

## 2019-04-17 DIAGNOSIS — Z1159 Encounter for screening for other viral diseases: Secondary | ICD-10-CM | POA: Diagnosis not present

## 2019-04-17 DIAGNOSIS — R471 Dysarthria and anarthria: Secondary | ICD-10-CM | POA: Diagnosis not present

## 2019-04-17 DIAGNOSIS — I69398 Other sequelae of cerebral infarction: Secondary | ICD-10-CM | POA: Diagnosis not present

## 2019-04-20 DIAGNOSIS — Z8673 Personal history of transient ischemic attack (TIA), and cerebral infarction without residual deficits: Secondary | ICD-10-CM | POA: Diagnosis not present

## 2019-04-20 DIAGNOSIS — I700 Atherosclerosis of aorta: Secondary | ICD-10-CM | POA: Diagnosis not present

## 2019-04-20 DIAGNOSIS — I639 Cerebral infarction, unspecified: Secondary | ICD-10-CM | POA: Diagnosis not present

## 2019-04-20 DIAGNOSIS — I69334 Monoplegia of upper limb following cerebral infarction affecting left non-dominant side: Secondary | ICD-10-CM | POA: Diagnosis not present

## 2019-04-20 DIAGNOSIS — Q211 Atrial septal defect: Secondary | ICD-10-CM | POA: Diagnosis not present

## 2019-05-10 DIAGNOSIS — Z0184 Encounter for antibody response examination: Secondary | ICD-10-CM | POA: Diagnosis not present

## 2019-05-10 DIAGNOSIS — Z1159 Encounter for screening for other viral diseases: Secondary | ICD-10-CM | POA: Diagnosis not present

## 2019-05-16 DIAGNOSIS — I639 Cerebral infarction, unspecified: Secondary | ICD-10-CM | POA: Diagnosis not present

## 2019-05-16 DIAGNOSIS — Z8673 Personal history of transient ischemic attack (TIA), and cerebral infarction without residual deficits: Secondary | ICD-10-CM | POA: Diagnosis not present

## 2019-05-16 DIAGNOSIS — I080 Rheumatic disorders of both mitral and aortic valves: Secondary | ICD-10-CM | POA: Diagnosis not present

## 2019-05-16 DIAGNOSIS — Z136 Encounter for screening for cardiovascular disorders: Secondary | ICD-10-CM | POA: Diagnosis not present

## 2019-05-19 DIAGNOSIS — Z8673 Personal history of transient ischemic attack (TIA), and cerebral infarction without residual deficits: Secondary | ICD-10-CM | POA: Diagnosis not present

## 2019-05-19 DIAGNOSIS — I10 Essential (primary) hypertension: Secondary | ICD-10-CM | POA: Diagnosis not present

## 2019-05-19 DIAGNOSIS — I639 Cerebral infarction, unspecified: Secondary | ICD-10-CM | POA: Diagnosis not present

## 2019-05-19 DIAGNOSIS — I69398 Other sequelae of cerebral infarction: Secondary | ICD-10-CM | POA: Diagnosis not present

## 2019-05-22 DIAGNOSIS — R7301 Impaired fasting glucose: Secondary | ICD-10-CM | POA: Diagnosis not present

## 2019-05-22 DIAGNOSIS — I639 Cerebral infarction, unspecified: Secondary | ICD-10-CM | POA: Diagnosis not present

## 2019-05-22 DIAGNOSIS — Z114 Encounter for screening for human immunodeficiency virus [HIV]: Secondary | ICD-10-CM | POA: Diagnosis not present

## 2019-05-22 DIAGNOSIS — Z8673 Personal history of transient ischemic attack (TIA), and cerebral infarction without residual deficits: Secondary | ICD-10-CM | POA: Diagnosis not present

## 2019-05-24 DIAGNOSIS — D6869 Other thrombophilia: Secondary | ICD-10-CM | POA: Diagnosis not present

## 2019-06-14 DIAGNOSIS — D6869 Other thrombophilia: Secondary | ICD-10-CM | POA: Diagnosis not present

## 2019-06-14 DIAGNOSIS — Z8673 Personal history of transient ischemic attack (TIA), and cerebral infarction without residual deficits: Secondary | ICD-10-CM | POA: Diagnosis not present

## 2019-06-15 DIAGNOSIS — Z8673 Personal history of transient ischemic attack (TIA), and cerebral infarction without residual deficits: Secondary | ICD-10-CM | POA: Diagnosis not present

## 2019-06-15 DIAGNOSIS — R942 Abnormal results of pulmonary function studies: Secondary | ICD-10-CM | POA: Diagnosis not present

## 2019-06-15 DIAGNOSIS — I639 Cerebral infarction, unspecified: Secondary | ICD-10-CM | POA: Diagnosis not present

## 2019-06-15 DIAGNOSIS — D6869 Other thrombophilia: Secondary | ICD-10-CM | POA: Diagnosis not present

## 2019-07-18 DIAGNOSIS — I6521 Occlusion and stenosis of right carotid artery: Secondary | ICD-10-CM | POA: Diagnosis not present

## 2019-07-18 DIAGNOSIS — E782 Mixed hyperlipidemia: Secondary | ICD-10-CM | POA: Diagnosis not present

## 2019-07-18 DIAGNOSIS — Z7189 Other specified counseling: Secondary | ICD-10-CM | POA: Diagnosis not present

## 2019-07-18 DIAGNOSIS — I10 Essential (primary) hypertension: Secondary | ICD-10-CM | POA: Diagnosis not present

## 2019-07-24 DIAGNOSIS — Z961 Presence of intraocular lens: Secondary | ICD-10-CM | POA: Diagnosis not present

## 2019-08-24 ENCOUNTER — Other Ambulatory Visit: Payer: Self-pay | Admitting: Internal Medicine

## 2019-08-24 DIAGNOSIS — Z1231 Encounter for screening mammogram for malignant neoplasm of breast: Secondary | ICD-10-CM

## 2019-10-06 ENCOUNTER — Ambulatory Visit: Payer: Medicare Other

## 2019-10-12 ENCOUNTER — Ambulatory Visit: Payer: Medicare Other

## 2019-10-14 ENCOUNTER — Ambulatory Visit: Payer: Medicare Other

## 2019-10-27 ENCOUNTER — Ambulatory Visit: Payer: Medicare Other

## 2019-11-07 IMAGING — MG DIGITAL SCREENING BILATERAL MAMMOGRAM WITH TOMO AND CAD
8 series · 9 of 24 positions shown · non-contrast
Comparison: Previous exam(s).

ACR Breast Density Category a: The breast tissue is almost entirely
fatty.

CLINICAL DATA: Screening.

EXAM:
DIGITAL SCREENING BILATERAL MAMMOGRAM WITH TOMO AND CAD

[R MLO synth-2D]
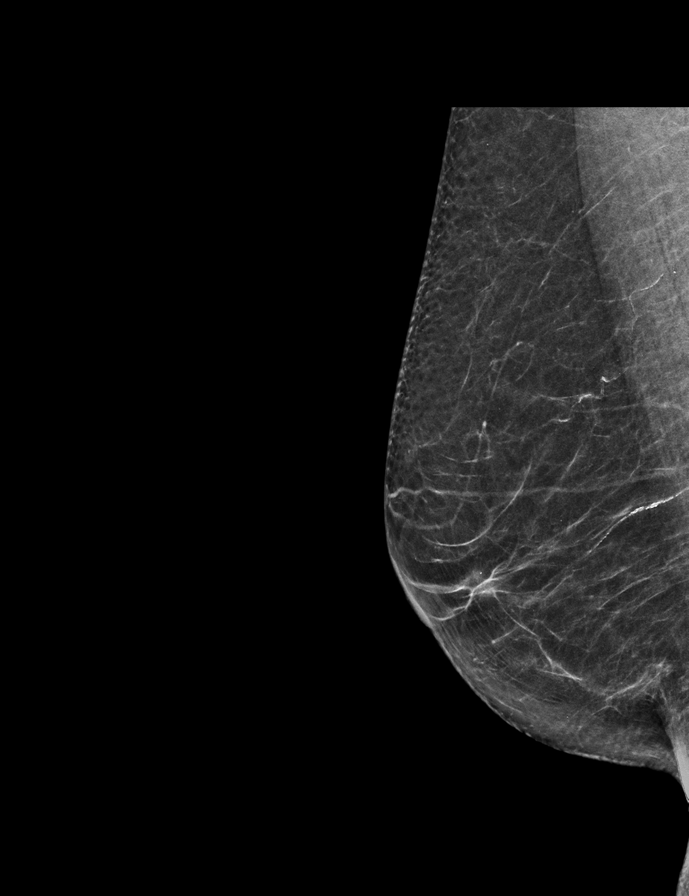

[L CC synth-2D]
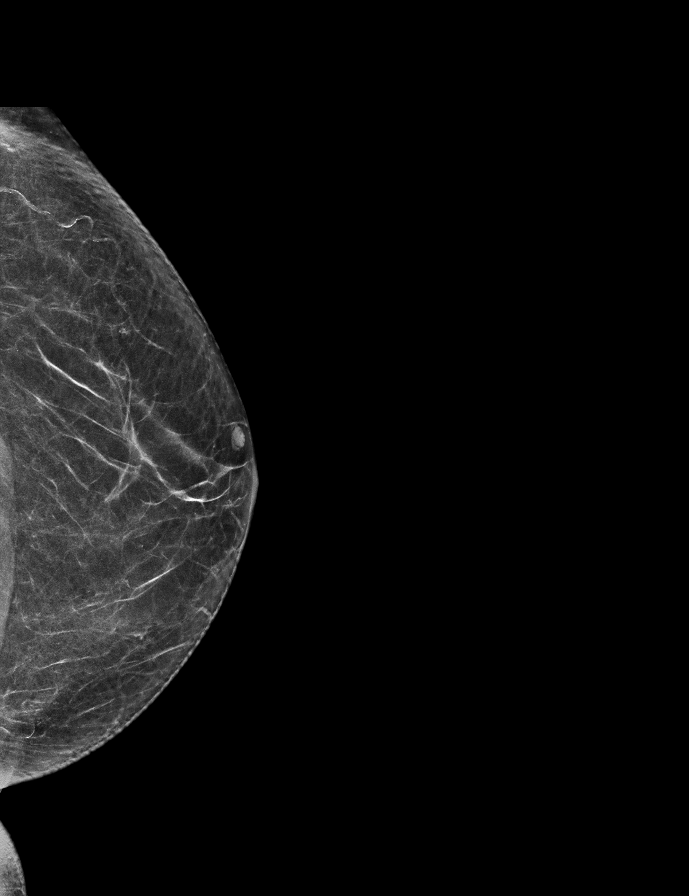

[R CC synth-2D]
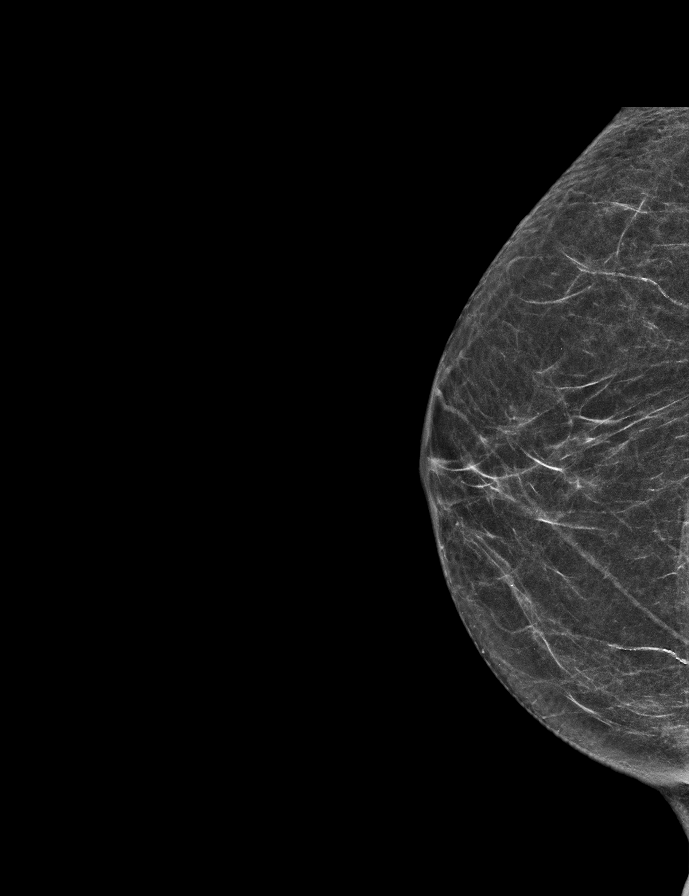

[L MLO synth-2D]
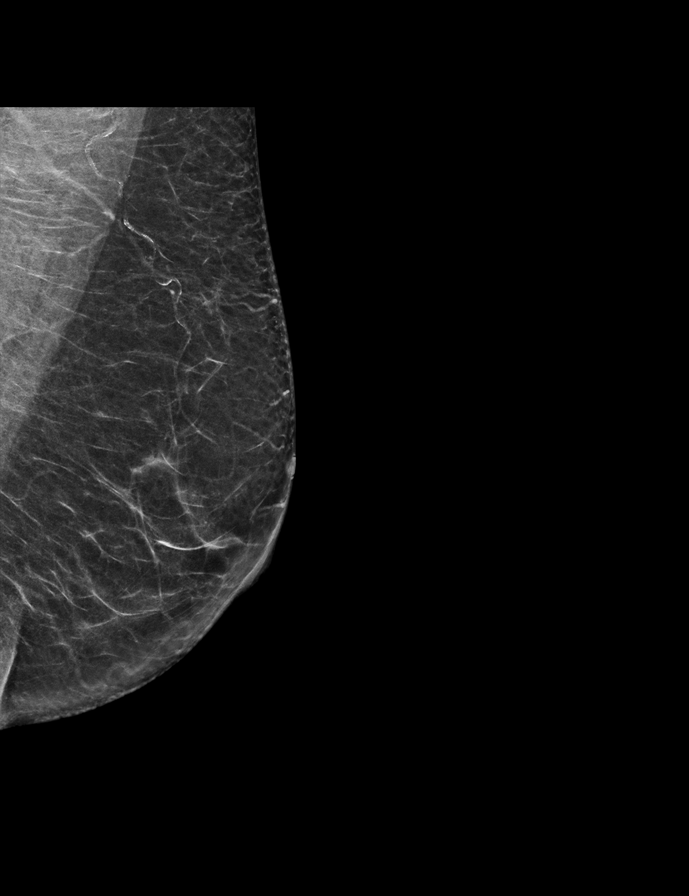

[L CC tomo · 2 of 58 frames shown]
[frame 19/58]
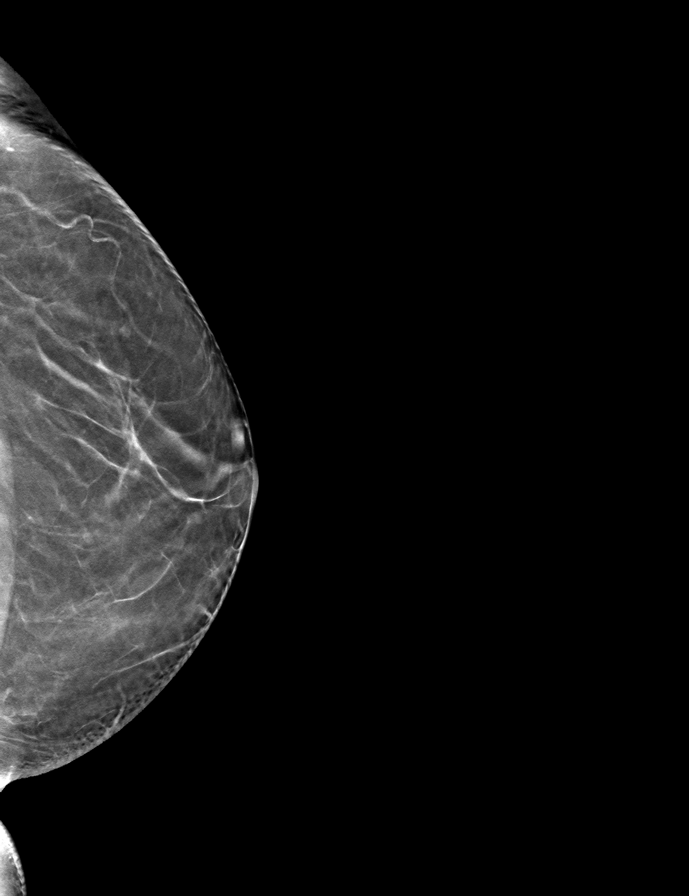
[frame 29/58]
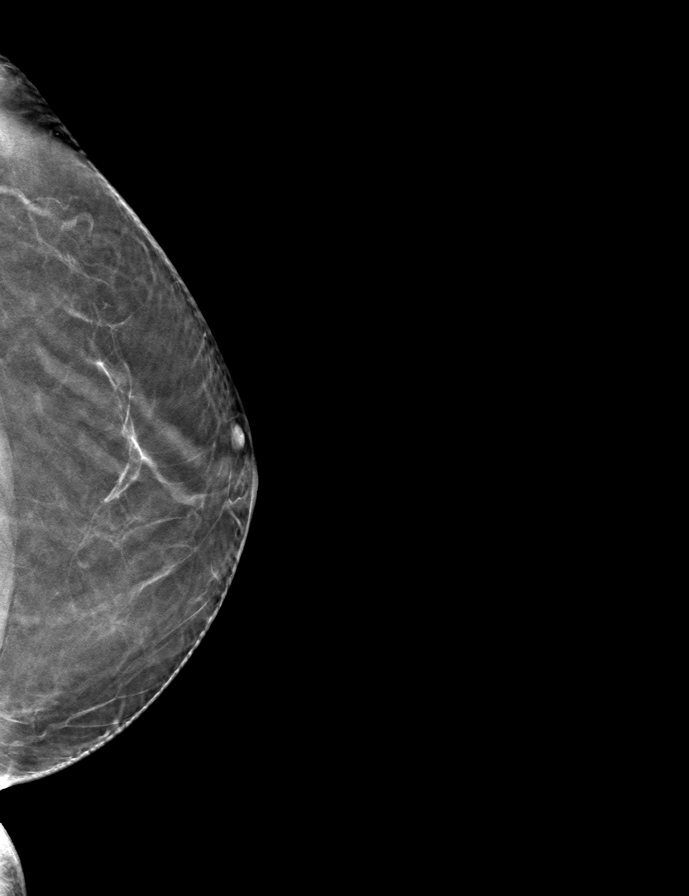

[R CC tomo · tomo slice 25/50.0]
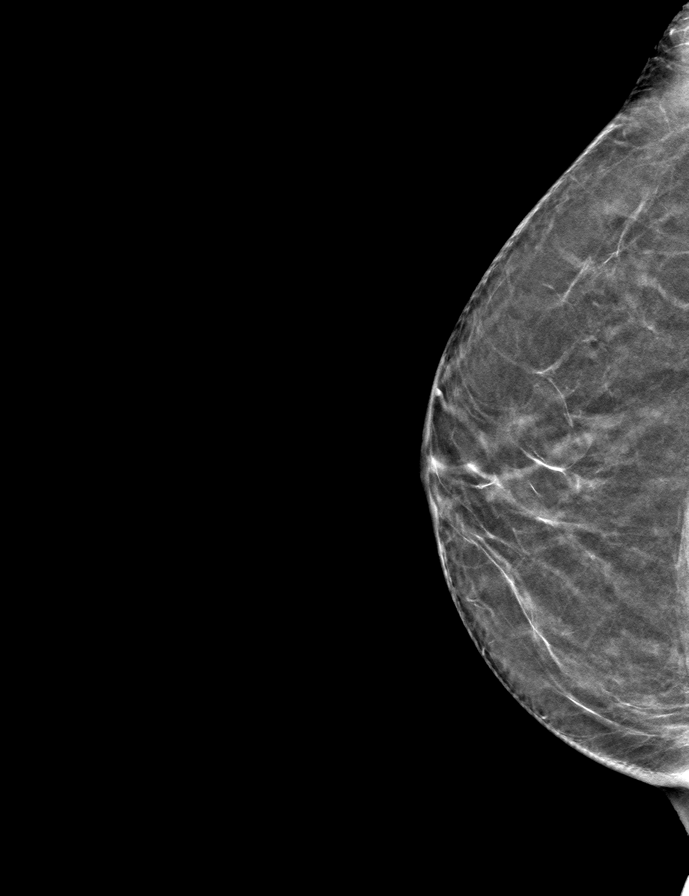

[L MLO tomo · tomo slice 30/59.0]
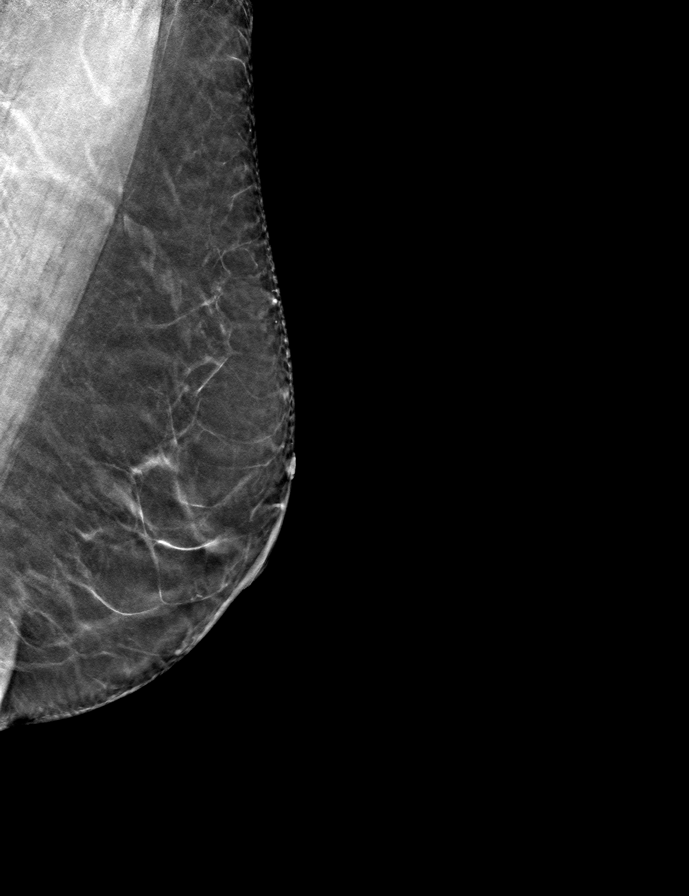

[R MLO tomo · tomo slice 26/51.0]
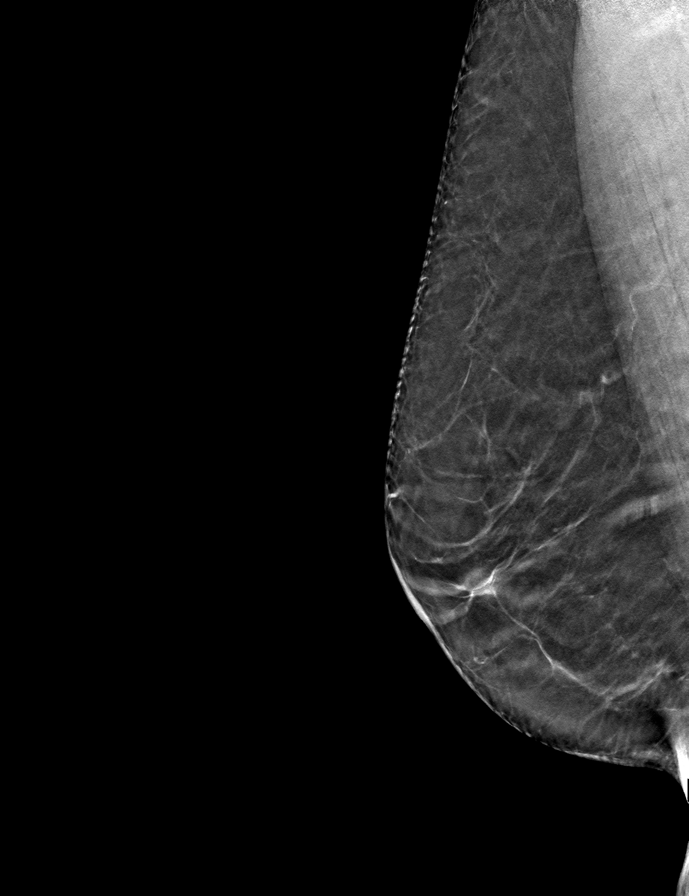

[9 of 24 positions shown; findings below may reference images not displayed]

FINDINGS: There are no findings suspicious for malignancy. Images were
processed with CAD.
IMPRESSION: No mammographic evidence of malignancy. A result letter of this
screening mammogram will be mailed directly to the patient.

RECOMMENDATION:
Screening mammogram in one year. (Code:[id number]-[id number])

BI-RADS CATEGORY  1: Negative.

## 2019-12-11 ENCOUNTER — Ambulatory Visit: Payer: Medicare Other

## 2020-01-03 ENCOUNTER — Other Ambulatory Visit: Payer: Self-pay

## 2020-01-03 ENCOUNTER — Ambulatory Visit
Admission: RE | Admit: 2020-01-03 | Discharge: 2020-01-03 | Disposition: A | Discharge status: Home / Self Care | Payer: Medicare Other | Source: Ambulatory Visit | Attending: Internal Medicine | Admitting: Internal Medicine

## 2020-01-03 ENCOUNTER — Ambulatory Visit: Payer: Medicare Other

## 2020-01-03 DIAGNOSIS — Z1231 Encounter for screening mammogram for malignant neoplasm of breast: Secondary | ICD-10-CM

## 2020-08-07 ENCOUNTER — Ambulatory Visit: Payer: Medicare Other | Admitting: Podiatry

## 2020-10-10 ENCOUNTER — Other Ambulatory Visit: Payer: Self-pay

## 2020-10-10 ENCOUNTER — Encounter: Payer: Self-pay | Admitting: Podiatry

## 2020-10-10 ENCOUNTER — Ambulatory Visit (INDEPENDENT_AMBULATORY_CARE_PROVIDER_SITE_OTHER): Payer: Medicare Other | Admitting: Podiatry

## 2020-10-10 DIAGNOSIS — B351 Tinea unguium: Secondary | ICD-10-CM | POA: Diagnosis not present

## 2020-10-10 MED ORDER — TERBINAFINE HCL 250 MG PO TABS: ORAL_TABLET | ORAL | 0 refills | Status: DC

## 2020-10-11 NOTE — Progress Notes (Signed)
Subjective:   Patient ID: Kristin Jimenez, female   DOB: 87 y.o.   MRN: [id number]   HPI Patient presents with nails which have yellowed and she states that she does not like this and she would like to do anything she can do to try to get it better and she has had positive fungal cultures from last year   ROS      Objective:  Physical Exam  Neurovascular status intact with discoloration right hallux over left hallux localized to the distal two thirds of the bed and has a thick chalky-like material     Assessment:  Probability for mycotic nail infection bilateral with trauma also is precipitating factor     Plan:  H&P reviewed condition recommended combination of pulse antifungal therapy along with laser therapy.  Patient is scheduled all questions answered and I did review different treatment options also for her to consider but do not recommend removal currently or anything more aggressive

## 2020-11-01 ENCOUNTER — Ambulatory Visit (INDEPENDENT_AMBULATORY_CARE_PROVIDER_SITE_OTHER): Payer: Medicare Other

## 2020-11-01 ENCOUNTER — Other Ambulatory Visit: Payer: Self-pay

## 2020-11-01 DIAGNOSIS — B351 Tinea unguium: Secondary | ICD-10-CM

## 2020-11-01 NOTE — Progress Notes (Signed)
Patient presents today for the 1st laser treatment. Diagnosed with mycotic nail infection by Dr. Paulla Dolly.   Toenail most affected bilateral Hallux ad right 3rd.  All other systems are negative.  Nails were filed thin. Laser therapy was administered to bilateral hallux and right 3rd toenails and patient tolerated the treatment well. All safety precautions were in place.    Follow up in 4 weeks for laser # 2.  Please take picture of nails at next visit to document visual progress

## 2020-11-27 ENCOUNTER — Other Ambulatory Visit: Payer: Self-pay | Admitting: Internal Medicine

## 2020-11-27 DIAGNOSIS — Z1231 Encounter for screening mammogram for malignant neoplasm of breast: Secondary | ICD-10-CM

## 2020-11-29 ENCOUNTER — Ambulatory Visit (INDEPENDENT_AMBULATORY_CARE_PROVIDER_SITE_OTHER): Payer: Medicare Other | Admitting: *Deleted

## 2020-11-29 DIAGNOSIS — B351 Tinea unguium: Secondary | ICD-10-CM

## 2020-11-29 NOTE — Progress Notes (Signed)
Patient presents today for the 2nd laser treatment. Diagnosed with mycotic nail infection by Dr. Paulla Dolly.   Toenail most affected hallux nails bilateral and 3rd nail right.  All other systems are negative.  Nails were filed thin. Laser therapy was administered to 1st bilateral and 3rd toenail right and patient tolerated the treatment well. All safety precautions were in place.   Patient was prescribed oral terbinafine pulse dosing, but she has decided not to take it due to reading the possible side effects of the medication.   Follow up in 4 weeks for laser # 3.  Picture of nails taken today to document visual progress

## 2021-01-03 ENCOUNTER — Other Ambulatory Visit: Payer: Self-pay

## 2021-01-03 ENCOUNTER — Ambulatory Visit (INDEPENDENT_AMBULATORY_CARE_PROVIDER_SITE_OTHER): Payer: Medicare Other

## 2021-01-03 DIAGNOSIS — B351 Tinea unguium: Secondary | ICD-10-CM

## 2021-01-03 NOTE — Progress Notes (Signed)
Patient presents today for the 3rd laser treatment. Diagnosed with mycotic nail infection by Dr. Paulla Dolly.   Toenail most affected hallux nails bilateral and 3rd nail right.  All other systems are negative.  Nails were filed thin. Laser therapy was administered to 1st bilateral and 3rd toenail right and patient tolerated the treatment well. All safety precautions were in place.   Patient was prescribed oral terbinafine pulse dosing, but she has decided not to take it due to reading the possible side effects of the medication.   Follow up in 4 weeks for laser # 4.

## 2021-01-20 ENCOUNTER — Ambulatory Visit: Payer: Medicare Other

## 2021-01-21 ENCOUNTER — Ambulatory Visit: Payer: Medicare Other

## 2021-02-07 ENCOUNTER — Ambulatory Visit (INDEPENDENT_AMBULATORY_CARE_PROVIDER_SITE_OTHER): Payer: Medicare Other

## 2021-02-07 ENCOUNTER — Other Ambulatory Visit: Payer: Self-pay

## 2021-02-07 ENCOUNTER — Ambulatory Visit
Admission: RE | Admit: 2021-02-07 | Discharge: 2021-02-07 | Disposition: A | Discharge status: Home / Self Care | Payer: Medicare Other | Source: Ambulatory Visit | Attending: Internal Medicine | Admitting: Internal Medicine

## 2021-02-07 ENCOUNTER — Other Ambulatory Visit: Payer: Medicare Other

## 2021-02-07 DIAGNOSIS — Z1231 Encounter for screening mammogram for malignant neoplasm of breast: Secondary | ICD-10-CM

## 2021-02-07 DIAGNOSIS — B351 Tinea unguium: Secondary | ICD-10-CM

## 2021-02-07 NOTE — Progress Notes (Signed)
Patient presents today for the 4th laser treatment. Diagnosed with mycotic nail infection by Dr. Paulla Dolly.   Toenail most affected hallux nails bilateral and 3rd nail right.  All other systems are negative.  Nails were filed thin. Laser therapy was administered to 1st bilateral and 3rd toenail right and patient tolerated the treatment well. All safety precautions were in place.   Patient was prescribed oral terbinafine pulse dosing, but she has decided not to take it due to reading the possible side effects of the medication.   Follow up in 4 weeks for laser #5.

## 2021-02-08 ENCOUNTER — Ambulatory Visit: Payer: Medicare Other

## 2021-03-28 ENCOUNTER — Other Ambulatory Visit: Payer: Self-pay

## 2021-03-28 ENCOUNTER — Ambulatory Visit (INDEPENDENT_AMBULATORY_CARE_PROVIDER_SITE_OTHER): Payer: Medicare Other | Admitting: *Deleted

## 2021-03-28 DIAGNOSIS — B351 Tinea unguium: Secondary | ICD-10-CM

## 2021-03-28 NOTE — Progress Notes (Signed)
Patient presents today for the 5th laser treatment. Diagnosed with mycotic nail infection by Dr. Paulla Dolly.   Toenail most affected hallux nails bilateral and 3rd nail right.  All other systems are negative.  Nails were filed thin. Laser therapy was administered to 1st bilateral and 3rd toenail right and patient tolerated the treatment well. All safety precautions were in place.   Patient was prescribed oral terbinafine pulse dosing, but she has decided not to take it due to reading the possible side effects of the medication.   Follow up in 8 weeks for laser #6

## 2021-05-30 ENCOUNTER — Ambulatory Visit (INDEPENDENT_AMBULATORY_CARE_PROVIDER_SITE_OTHER): Payer: Medicare Other

## 2021-05-30 ENCOUNTER — Other Ambulatory Visit: Payer: Self-pay

## 2021-05-30 DIAGNOSIS — B351 Tinea unguium: Secondary | ICD-10-CM

## 2021-05-30 NOTE — Progress Notes (Signed)
Patient presents today for the 6th laser treatment. Diagnosed with mycotic nail infection by Dr. Paulla Dolly.   Toenail most affected hallux nails bilateral and 3rd nail right.  All other systems are negative.  Nails were filed thin. Laser therapy was administered to 1st bilateral and 3rd toenail right and patient tolerated the treatment well. All safety precautions were in place.   Patient was prescribed oral terbinafine pulse dosing, but she has decided not to take it due to reading the possible side effects of the medication.   Patient has completed the recommended laser treatments. He will follow up with Dr. Paulla Dolly  in 3 months to evaluate progress.

## 2021-09-10 ENCOUNTER — Other Ambulatory Visit: Payer: Self-pay

## 2021-09-10 ENCOUNTER — Ambulatory Visit (INDEPENDENT_AMBULATORY_CARE_PROVIDER_SITE_OTHER): Payer: Medicare Other | Admitting: Podiatry

## 2021-09-10 ENCOUNTER — Encounter: Payer: Self-pay | Admitting: Podiatry

## 2021-09-10 DIAGNOSIS — B351 Tinea unguium: Secondary | ICD-10-CM | POA: Diagnosis not present

## 2021-09-10 NOTE — Progress Notes (Signed)
Subjective:   Patient ID: Kristin Jimenez, female   DOB: 88 y.o.   MRN: [id number]   HPI Patient states improved knowing that were not can get complete improvement but did well with medication and laser   ROS      Objective:  Physical Exam  Neurovascular status intact with improvement of nail condition with still some distal discoloration but better than previous with patient found to have no other pathology     Assessment:  In proved from previous treatments of fungal condition with probable trauma as part of pathology     Plan:  H&P reviewed condition recommended the continuation of conservative care and that ultimately more laser or antifungal therapy may be necessary

## 2021-09-29 ENCOUNTER — Ambulatory Visit: Payer: Medicare Other | Admitting: Podiatry

## 2021-10-14 ENCOUNTER — Encounter: Payer: Self-pay | Admitting: Gastroenterology

## 2021-10-15 ENCOUNTER — Other Ambulatory Visit (HOSPITAL_COMMUNITY): Payer: Self-pay

## 2021-10-15 MED ORDER — ENOXAPARIN SODIUM 100 MG/ML IJ SOSY
PREFILLED_SYRINGE | INTRAMUSCULAR | 0 refills | Status: AC
  Filled 2021-10-15: qty 10, 10d supply, fill #0

## 2021-10-22 ENCOUNTER — Telehealth: Payer: Self-pay | Admitting: Gastroenterology

## 2021-10-22 NOTE — Telephone Encounter (Signed)
I agree. At age 88 we should cancel this colonoscopy recall and future recalls.

## 2021-10-22 NOTE — Telephone Encounter (Signed)
Inbound call from patient stating that she got a letter in the mail that it was time for her to have a colonoscopy. Patient stated that she is 29 and believes that she is not in need of one. Just wanted to inform Dr. Fuller Plan.

## 2021-10-22 NOTE — Telephone Encounter (Signed)
Unable to reach patient, left vm to call back.

## 2021-10-23 NOTE — Telephone Encounter (Signed)
Unable to reach patient, left vm to call back.

## 2021-10-23 NOTE — Telephone Encounter (Signed)
Patient returned call. States she does not need colonoscopy and she is fine

## 2021-12-05 ENCOUNTER — Ambulatory Visit (INDEPENDENT_AMBULATORY_CARE_PROVIDER_SITE_OTHER): Payer: Self-pay

## 2021-12-05 DIAGNOSIS — B351 Tinea unguium: Secondary | ICD-10-CM

## 2021-12-05 NOTE — Progress Notes (Signed)
Patient presents today for the 1st (2nd round) laser treatment. Diagnosed with mycotic nail infection by Dr. Paulla Dolly.  ? ?Toenail most affected hallux nails bilateral and 3rd nail right. ? ?All other systems are negative. ? ?Nails were filed thin. Laser therapy was administered to 1st bilateral and 3rd toenail right and patient tolerated the treatment well. All safety precautions were in place.  ? ?Patient was prescribed oral terbinafine pulse dosing, but she has decided not to take it due to reading the possible side effects of the medication. ? ? ?Follow up in 8 weeks for laser #2 ? ?

## 2022-01-02 ENCOUNTER — Other Ambulatory Visit: Payer: Self-pay | Admitting: Internal Medicine

## 2022-01-02 DIAGNOSIS — Z1231 Encounter for screening mammogram for malignant neoplasm of breast: Secondary | ICD-10-CM

## 2022-01-23 ENCOUNTER — Other Ambulatory Visit: Payer: Medicare Other

## 2022-02-11 ENCOUNTER — Ambulatory Visit: Payer: Medicare Other

## 2022-04-03 ENCOUNTER — Ambulatory Visit: Payer: Medicare Other

## 2022-04-06 ENCOUNTER — Ambulatory Visit: Payer: Medicare Other

## 2022-04-22 ENCOUNTER — Ambulatory Visit
Admission: RE | Admit: 2022-04-22 | Discharge: 2022-04-22 | Disposition: A | Discharge status: Home / Self Care | Payer: Medicare Other | Source: Ambulatory Visit | Attending: Internal Medicine | Admitting: Internal Medicine

## 2022-04-22 DIAGNOSIS — Z1231 Encounter for screening mammogram for malignant neoplasm of breast: Secondary | ICD-10-CM

## 2022-04-24 ENCOUNTER — Other Ambulatory Visit: Payer: Self-pay | Admitting: Internal Medicine

## 2022-04-24 DIAGNOSIS — R928 Other abnormal and inconclusive findings on diagnostic imaging of breast: Secondary | ICD-10-CM

## 2022-05-04 ENCOUNTER — Ambulatory Visit
Admission: RE | Admit: 2022-05-04 | Discharge: 2022-05-04 | Disposition: A | Discharge status: Home / Self Care | Payer: Medicare Other | Source: Ambulatory Visit | Attending: Internal Medicine | Admitting: Internal Medicine

## 2022-05-04 DIAGNOSIS — R928 Other abnormal and inconclusive findings on diagnostic imaging of breast: Secondary | ICD-10-CM

## 2024-01-13 ENCOUNTER — Other Ambulatory Visit: Payer: Self-pay | Admitting: Registered Nurse

## 2024-01-13 DIAGNOSIS — I6521 Occlusion and stenosis of right carotid artery: Secondary | ICD-10-CM

## 2024-01-27 ENCOUNTER — Other Ambulatory Visit
